# Patient Record
Sex: Female | Born: 1950 | Race: Black or African American | Hispanic: No | Marital: Single | Smoking: Former smoker
Health system: Southern US, Community
[De-identification: ages and names within clinical notes are randomized; demographics above are authoritative.]

## PROBLEM LIST (undated history)

## (undated) DIAGNOSIS — J9690 Respiratory failure, unspecified, unspecified whether with hypoxia or hypercapnia: Secondary | ICD-10-CM

## (undated) DIAGNOSIS — G4730 Sleep apnea, unspecified: Secondary | ICD-10-CM

## (undated) DIAGNOSIS — E119 Type 2 diabetes mellitus without complications: Secondary | ICD-10-CM

## (undated) DIAGNOSIS — I509 Heart failure, unspecified: Secondary | ICD-10-CM

## (undated) DIAGNOSIS — N289 Disorder of kidney and ureter, unspecified: Secondary | ICD-10-CM

## (undated) DIAGNOSIS — I480 Paroxysmal atrial fibrillation: Secondary | ICD-10-CM

---

## 2021-07-02 DIAGNOSIS — E1129 Type 2 diabetes mellitus with other diabetic kidney complication: Secondary | ICD-10-CM | POA: Diagnosis present

## 2021-07-02 DIAGNOSIS — Z950 Presence of cardiac pacemaker: Secondary | ICD-10-CM | POA: Diagnosis present

## 2021-07-02 DIAGNOSIS — Z794 Long term (current) use of insulin: Secondary | ICD-10-CM

## 2021-07-02 DIAGNOSIS — N189 Chronic kidney disease, unspecified: Secondary | ICD-10-CM | POA: Diagnosis present

## 2021-07-02 DIAGNOSIS — I5020 Unspecified systolic (congestive) heart failure: Secondary | ICD-10-CM | POA: Diagnosis present

## 2021-07-02 DIAGNOSIS — E1140 Type 2 diabetes mellitus with diabetic neuropathy, unspecified: Secondary | ICD-10-CM

## 2021-07-02 DIAGNOSIS — D631 Anemia in chronic kidney disease: Secondary | ICD-10-CM | POA: Diagnosis present

## 2021-07-10 ENCOUNTER — Emergency Department
Admission: EM | Admit: 2021-07-10 | Discharge: 2021-07-10 | Disposition: A | Discharge status: Home / Self Care | Payer: Medicare HMO | Attending: Emergency Medicine | Admitting: Emergency Medicine

## 2021-07-10 ENCOUNTER — Other Ambulatory Visit: Payer: Self-pay

## 2021-07-10 ENCOUNTER — Emergency Department: Payer: Medicare HMO

## 2021-07-10 ENCOUNTER — Encounter: Payer: Self-pay | Admitting: Radiology

## 2021-07-10 DIAGNOSIS — R42 Dizziness and giddiness: Secondary | ICD-10-CM | POA: Diagnosis present

## 2021-07-10 DIAGNOSIS — Z992 Dependence on renal dialysis: Secondary | ICD-10-CM | POA: Diagnosis not present

## 2021-07-10 DIAGNOSIS — R1110 Vomiting, unspecified: Secondary | ICD-10-CM | POA: Insufficient documentation

## 2021-07-10 DIAGNOSIS — N186 End stage renal disease: Secondary | ICD-10-CM | POA: Diagnosis not present

## 2021-07-10 DIAGNOSIS — I509 Heart failure, unspecified: Secondary | ICD-10-CM | POA: Insufficient documentation

## 2021-07-10 DIAGNOSIS — Z20822 Contact with and (suspected) exposure to covid-19: Secondary | ICD-10-CM | POA: Insufficient documentation

## 2021-07-10 DIAGNOSIS — M533 Sacrococcygeal disorders, not elsewhere classified: Secondary | ICD-10-CM | POA: Insufficient documentation

## 2021-07-10 DIAGNOSIS — E1122 Type 2 diabetes mellitus with diabetic chronic kidney disease: Secondary | ICD-10-CM | POA: Insufficient documentation

## 2021-07-10 HISTORY — DX: Heart failure, unspecified: I50.9

## 2021-07-10 HISTORY — DX: Type 2 diabetes mellitus without complications: E11.9

## 2021-07-10 HISTORY — DX: Sleep apnea, unspecified: G47.30

## 2021-07-10 HISTORY — DX: Disorder of kidney and ureter, unspecified: N28.9

## 2021-07-10 HISTORY — DX: Respiratory failure, unspecified, unspecified whether with hypoxia or hypercapnia: J96.90

## 2021-07-10 HISTORY — DX: Paroxysmal atrial fibrillation: I48.0

## 2021-07-10 LAB — CBC WITH DIFFERENTIAL/PLATELET
Abs Immature Granulocytes: 0.03 10*3/uL (ref 0.00–0.07)
Basophils Absolute: 0.1 10*3/uL (ref 0.0–0.1)
Basophils Relative: 1 %
Eosinophils Absolute: 0.3 10*3/uL (ref 0.0–0.5)
Eosinophils Relative: 3 %
HCT: 33.1 % — ABNORMAL LOW (ref 36.0–46.0)
Hemoglobin: 9.5 g/dL — ABNORMAL LOW (ref 12.0–15.0)
Immature Granulocytes: 0 %
Lymphocytes Relative: 17 %
Lymphs Abs: 1.7 10*3/uL (ref 0.7–4.0)
MCH: 25.8 pg — ABNORMAL LOW (ref 26.0–34.0)
MCHC: 28.7 g/dL — ABNORMAL LOW (ref 30.0–36.0)
MCV: 89.9 fL (ref 80.0–100.0)
Monocytes Absolute: 1.1 10*3/uL — ABNORMAL HIGH (ref 0.1–1.0)
Monocytes Relative: 11 %
Neutro Abs: 6.7 10*3/uL (ref 1.7–7.7)
Neutrophils Relative %: 68 %
Platelets: 234 10*3/uL (ref 150–400)
RBC: 3.68 MIL/uL — ABNORMAL LOW (ref 3.87–5.11)
RDW: 20.1 % — ABNORMAL HIGH (ref 11.5–15.5)
WBC: 9.9 10*3/uL (ref 4.0–10.5)
nRBC: 0.4 % — ABNORMAL HIGH (ref 0.0–0.2)

## 2021-07-10 LAB — COMPREHENSIVE METABOLIC PANEL
ALT: 8 U/L (ref 0–44)
AST: 13 U/L — ABNORMAL LOW (ref 15–41)
Albumin: 2.8 g/dL — ABNORMAL LOW (ref 3.5–5.0)
Alkaline Phosphatase: 136 U/L — ABNORMAL HIGH (ref 38–126)
Anion gap: 14 (ref 5–15)
BUN: 38 mg/dL — ABNORMAL HIGH (ref 8–23)
CO2: 26 mmol/L (ref 22–32)
Calcium: 8.6 mg/dL — ABNORMAL LOW (ref 8.9–10.3)
Chloride: 96 mmol/L — ABNORMAL LOW (ref 98–111)
Creatinine, Ser: 5.78 mg/dL — ABNORMAL HIGH (ref 0.44–1.00)
GFR, Estimated: 8 mL/min — ABNORMAL LOW (ref >60)
Glucose, Bld: 152 mg/dL — ABNORMAL HIGH (ref 70–99)
Potassium: 4.8 mmol/L (ref 3.5–5.1)
Sodium: 136 mmol/L (ref 135–145)
Total Bilirubin: 1.6 mg/dL — ABNORMAL HIGH (ref 0.3–1.2)
Total Protein: 7.1 g/dL (ref 6.5–8.1)

## 2021-07-10 LAB — RESP PANEL BY RT-PCR (FLU A&B, COVID) ARPGX2
Influenza A by PCR: NEGATIVE
Influenza B by PCR: NEGATIVE
SARS Coronavirus 2 by RT PCR: NEGATIVE

## 2021-07-10 LAB — TROPONIN I (HIGH SENSITIVITY)
Troponin I (High Sensitivity): 27 ng/L — ABNORMAL HIGH (ref <18)
Troponin I (High Sensitivity): 25 ng/L — ABNORMAL HIGH (ref <18)

## 2021-07-10 LAB — LIPASE, BLOOD: Lipase: 29 U/L (ref 11–51)

## 2021-07-10 MED ORDER — SODIUM CHLORIDE 0.9 % IV BOLUS
500.0000 mL | Freq: Once | INTRAVENOUS | Status: AC
  Administered 2021-07-10: 500 mL via INTRAVENOUS

## 2021-07-10 MED ORDER — OXYCODONE-ACETAMINOPHEN 5-325 MG PO TABS
1.0000 | ORAL_TABLET | Freq: Once | ORAL | Status: AC
  Administered 2021-07-10: 1 via ORAL
  Filled 2021-07-10: qty 1

## 2021-07-10 MED ORDER — IOHEXOL 350 MG/ML SOLN
100.0000 mL | Freq: Once | INTRAVENOUS | Status: AC | PRN
  Administered 2021-07-10: 100 mL via INTRAVENOUS

## 2021-07-10 NOTE — Discharge Instructions (Addendum)
Your lab tests and CT scan of the abdomen today were all okay.  Please call your dialysis clinic today to arrange a make-up session, and continue following your usual dialysis schedule.

## 2021-07-10 NOTE — ED Triage Notes (Signed)
Pt states that she started feeling 3 days ago. Pt states she thinks that its because a pill they are giving her. Pt cannot recall the pill. Pt is a dialysis patient and last had dialysis on Tuesday. Pt also complaining of a sore on her bottom hurting. Pt does not ambulate.

## 2021-07-10 NOTE — ED Notes (Signed)
X3 unsuccessfull ultrasound IV sticks by manager Marya Amsler, RN. Dr. Joni Fears made aware.

## 2021-07-10 NOTE — ED Provider Notes (Signed)
[facility] Emergency Department Provider Note  ____________________________________________  Time seen: Approximately 9:09 AM  I have reviewed the triage vital signs and the nursing notes.   HISTORY  Chief Complaint Dizziness    HPI Linda Quinn is a 70 y.o. female with a history of CHF diabetes paroxysmal atrial fibrillation and end-stage renal disease on hemodialysis Tuesday Thursday Saturday who comes ED complaining of lightheadedness for the past 3 days.  Also reports vomiting yesterday.  Feels like she has been eating less but still able to take food by mouth.  Also complains of some sacral pain during this time.  No chest pain or shortness of breath, no abdominal pain.  No fever.  No cough.  No body aches.  Last had dialysis 2 days ago.   Past Medical History:  Diagnosis Date   CHF (congestive heart failure) (HCC)    Diabetes mellitus without complication (HCC)    Paroxysmal atrial fibrillation (HCC)    Renal disorder    Respiratory failure (HCC)    Sleep apnea      There are no problems to display for this patient.       Prior to Admission medications   Not on File  MAR from SNF reviewed   Allergies Duricef [cefadroxil]   No family history on file.  Social History Social History   Substance Use Topics   Alcohol use: Not Currently   Drug use: Not Currently    Review of Systems  Constitutional:   No fever or chills.  ENT:   No sore throat. No rhinorrhea. Cardiovascular:   No chest pain or syncope. Respiratory:   No dyspnea or cough. Gastrointestinal:   Negative for abdominal pain, vomiting and diarrhea.  Musculoskeletal:   Positive sacral pain All other systems reviewed and are negative except as documented above in ROS and HPI.  ____________________________________________   PHYSICAL EXAM:  VITAL SIGNS: ED Triage Vitals  Enc Vitals Group     BP 07/10/21 0712 (!) 161/130     Pulse Rate 07/10/21 0712 76      Resp --      Temp 07/10/21 0712 97.6 F (36.4 C)     Temp Source 07/10/21 0712 Oral     SpO2 07/10/21 0709 94 %     Weight 07/10/21 0714 285 lb (129.3 kg)     Height 07/10/21 0714 '5\' 8"'$  (1.727 m)     Head Circumference --      Peak Flow --      Pain Score --      Pain Loc --      Pain Edu? --      Excl. in [place]? --     Vital signs reviewed, nursing assessments reviewed.   Constitutional:   Alert and oriented.  Chronically ill-appearing, morbidly obese Eyes:   Conjunctivae are normal. EOMI. PERRL. ENT      Head:   Normocephalic and atraumatic.      Nose:   Wearing a mask.      Mouth/Throat:   Wearing a mask.      Neck:   No meningismus. Full ROM. Hematological/Lymphatic/Immunilogical:   No cervical lymphadenopathy. Cardiovascular:   RRR. Symmetric bilateral radial and DP pulses.  No murmurs. Cap refill less than 2 seconds. Respiratory:   Normal respiratory effort without tachypnea/retractions. Breath sounds are clear and equal bilaterally. No wheezes/rales/rhonchi. Gastrointestinal:   Soft and nontender. Non distended. There is no CVA tenderness.  No rebound, rigidity, or guarding.  Brown stool,  Hemoccult negative Genitourinary:   deferred Musculoskeletal:   Normal range of motion in all extremities. No joint effusions.  No lower extremity tenderness.  No edema.  No identifiable sacral wound.  No bony tenderness Neurologic:   Normal speech and language.  Motor grossly intact. No acute focal neurologic deficits are appreciated.  Skin:    Skin is warm, dry and intact. No rash noted.  No petechiae, purpura, or bullae.  ____________________________________________    LABS (pertinent positives/negatives) (all labs ordered are listed, but only abnormal results are displayed) Labs Reviewed  COMPREHENSIVE METABOLIC PANEL - Abnormal; Notable for the following components:      Result Value   Chloride 96 (*)    Glucose, Bld 152 (*)    BUN 38 (*)    Creatinine, Ser 5.78 (*)     Calcium 8.6 (*)    Albumin 2.8 (*)    AST 13 (*)    Alkaline Phosphatase 136 (*)    Total Bilirubin 1.6 (*)    GFR, Estimated 8 (*)    All other components within normal limits  CBC WITH DIFFERENTIAL/PLATELET - Abnormal; Notable for the following components:   RBC 3.68 (*)    Hemoglobin 9.5 (*)    HCT 33.1 (*)    MCH 25.8 (*)    MCHC 28.7 (*)    RDW 20.1 (*)    nRBC 0.4 (*)    Monocytes Absolute 1.1 (*)    All other components within normal limits  TROPONIN I (HIGH SENSITIVITY) - Abnormal; Notable for the following components:   Troponin I (High Sensitivity) 27 (*)    All other components within normal limits  TROPONIN I (HIGH SENSITIVITY) - Abnormal; Notable for the following components:   Troponin I (High Sensitivity) 25 (*)    All other components within normal limits  RESP PANEL BY RT-PCR (FLU A&B, COVID) ARPGX2  LIPASE, BLOOD   ____________________________________________   EKG  Interpreted by me Sinus rhythm rate of 77, normal axis and intervals.  Left bundle branch block.  No acute ischemic changes.  ____________________________________________    RADIOLOGY  CT ABDOMEN PELVIS W CONTRAST  Result Date: 07/10/2021 CLINICAL DATA:  Nausea and vomiting for 3 days EXAM: CT ABDOMEN AND PELVIS WITH CONTRAST TECHNIQUE: Multidetector CT imaging of the abdomen and pelvis was performed using the standard protocol following bolus administration of intravenous contrast. CONTRAST:  162m OMNIPAQUE IOHEXOL 350 MG/ML SOLN COMPARISON:  None. FINDINGS: Lower chest: Mild basilar atelectasis is noted. Hepatobiliary: Fatty infiltration of the liver is noted. Gallbladder is well distended with dependent gallstones. No complicating factors are seen. Pancreas: Unremarkable. No pancreatic ductal dilatation or surrounding inflammatory changes. Spleen: Normal in size without focal abnormality. Adrenals/Urinary Tract: Adrenal glands are within normal limits. Kidneys demonstrate severe renal  vascular calcifications consistent with the given clinical history of end-stage renal disease. No significant of excretion of contrast is noted. The bladder is decompressed. Stomach/Bowel: Colon shows no obstructive or inflammatory changes. The appendix is within normal limits. Small bowel shows no obstructive or inflammatory changes. Stomach is within normal limits with the exception of a small sliding-type hiatal hernia. Vascular/Lymphatic: Significant atherosclerotic calcifications are noted throughout the vascular tree consistent with the given clinical history of end-stage renal disease. No sizable lymphadenopathy is identified. Reproductive: Status post hysterectomy. No adnexal masses. Other: No free fluid is noted.  Changes of anasarca are noted. Musculoskeletal: Degenerative changes of lumbar spine are noted. IMPRESSION: Cholelithiasis without complicating factors. Mild anasarca. Fatty liver. Diffuse vascular calcifications.  Electronically Signed   By: Inez Catalina M.D.   On: 07/10/2021 11:34   DG Chest Portable 1 View  Result Date: 07/10/2021 CLINICAL DATA:  Dizziness.  In stage renal disease. EXAM: PORTABLE CHEST 1 VIEW COMPARISON:  No prior. FINDINGS: Dialysis catheter with tip over cavoatrial junction. Cardiac pacer noted with lead tips over the right atrium right ventricle. Cardiomegaly. Mild bilateral interstitial prominence. Mild interstitial edema cannot be excluded. Low lung volumes with basilar atelectasis. Small left pleural effusion cannot be excluded. No pneumothorax. IMPRESSION: 1.  Dialysis catheter with tip over cavoatrial junction. 2. Cardiac pacer noted good anatomic position. Cardiomegaly with mild bilateral interstitial prominence suggesting interstitial edema. Small left pleural effusion cannot be excluded. 3.  Low lung volumes with bibasilar atelectasis. Electronically Signed   By: Marcello Moores  Register   On: 07/10/2021 08:12     ____________________________________________   PROCEDURES Angiocath insertion  Date/Time: 07/10/2021 12:27 PM Performed by: Carrie Mew, MD Authorized by: Carrie Mew, MD  Consent: Verbal consent obtained. Consent given by: patient Patient identity confirmed: verbally with patient Preparation: Patient was prepped and draped in the usual sterile fashion. Local anesthesia used: no  Anesthesia: Local anesthesia used: no  Sedation: Patient sedated: no  Patient tolerance: patient tolerated the procedure well with no immediate complications Comments: [place] continuous visualization. 1 attempt, EBL 0.  Necessary due to multiple failed attempts by nursing.    ____________________________________________  DIFFERENTIAL DIAGNOSIS   Electrolyte abnormality, dehydration, viral illness, pneumonia intracranial hemorrhage  CLINICAL IMPRESSION / ASSESSMENT AND PLAN / ED COURSE  Medications ordered in the ED: Medications  oxyCODONE-acetaminophen (PERCOCET/ROXICET) 5-325 MG per tablet 1 tablet (1 tablet Oral Given 07/10/21 0804)  sodium chloride 0.9 % bolus 500 mL (0 mLs Intravenous Stopped 07/10/21 1005)  iohexol (OMNIPAQUE) 350 MG/ML injection 100 mL (100 mLs Intravenous Contrast Given 07/10/21 1048)    Pertinent labs & imaging results that were available during my care of the patient were reviewed by me and considered in my medical decision making (see chart for details).  Linda Quinn was evaluated in Emergency Department on 07/10/2021 for the symptoms described in the history of present illness. She was evaluated in the context of the global COVID-19 pandemic, which necessitated consideration that the patient might be at risk for infection with the SARS-CoV-2 virus that causes COVID-19. Institutional protocols and algorithms that pertain to the evaluation of patients at risk for COVID-19 are in a state of rapid change based on information released by regulatory bodies including  the CDC and federal and state organizations. These policies and algorithms were followed during the patient's care in the ED.   Patient presents with lightheadedness for 3 days.  Also some musculoskeletal sacral pain which I think is due to being bedbound and prolonged pressure on the area, although I do not see any evidence of pressure ulcer at this point.  Will give small fluid bolus, check labs and COVID test.  Clinical Course as of 07/10/21 1226  Thu Jul 10, 2021  0934 Peripheral IV placed by me under ultrasound visualization.  20-gauge in right AC/median cubital vein. [PS]    Clinical Course User Index [PS] Carrie Mew, MD    ----------------------------------------- 12:26 PM on 07/10/2021 ----------------------------------------- Labs show baseline ESRD, otherwise unremarkable.  Serial troponins are flat.  CT abdomen pelvis unremarkable.  Suspect viral syndrome.  With reassuring work-up, patient can be discharged back to her healthcare facility and proceed with routine dialysis.   ____________________________________________   FINAL CLINICAL IMPRESSION(S) / ED  DIAGNOSES    Final diagnoses:  Dizziness  ESRD on hemodialysis [facility])  Morbid obesity [facility])     ED Discharge Orders     None       Portions of this note were generated with dragon dictation software. Dictation errors may occur despite best attempts at proofreading.    Carrie Mew, MD 07/10/21 1227

## 2021-07-23 ENCOUNTER — Emergency Department: Payer: Medicare HMO

## 2021-07-23 ENCOUNTER — Encounter: Payer: Self-pay | Admitting: Emergency Medicine

## 2021-07-23 ENCOUNTER — Other Ambulatory Visit: Payer: Self-pay

## 2021-07-23 ENCOUNTER — Inpatient Hospital Stay
Admission: EM | Admit: 2021-07-23 | Discharge: 2021-07-29 | DRG: 862 | Disposition: A | Discharge status: Skilled Nursing Facility | Payer: Medicare HMO | Attending: Internal Medicine | Admitting: Internal Medicine

## 2021-07-23 DIAGNOSIS — I4819 Other persistent atrial fibrillation: Secondary | ICD-10-CM | POA: Diagnosis present

## 2021-07-23 DIAGNOSIS — L89152 Pressure ulcer of sacral region, stage 2: Secondary | ICD-10-CM | POA: Diagnosis present

## 2021-07-23 DIAGNOSIS — D631 Anemia in chronic kidney disease: Secondary | ICD-10-CM | POA: Diagnosis present

## 2021-07-23 DIAGNOSIS — I5020 Unspecified systolic (congestive) heart failure: Secondary | ICD-10-CM | POA: Diagnosis present

## 2021-07-23 DIAGNOSIS — R0989 Other specified symptoms and signs involving the circulatory and respiratory systems: Secondary | ICD-10-CM

## 2021-07-23 DIAGNOSIS — E1122 Type 2 diabetes mellitus with diabetic chronic kidney disease: Secondary | ICD-10-CM | POA: Diagnosis present

## 2021-07-23 DIAGNOSIS — Z95810 Presence of automatic (implantable) cardiac defibrillator: Secondary | ICD-10-CM

## 2021-07-23 DIAGNOSIS — E1129 Type 2 diabetes mellitus with other diabetic kidney complication: Secondary | ICD-10-CM | POA: Diagnosis present

## 2021-07-23 DIAGNOSIS — G8929 Other chronic pain: Secondary | ICD-10-CM | POA: Diagnosis present

## 2021-07-23 DIAGNOSIS — I959 Hypotension, unspecified: Secondary | ICD-10-CM

## 2021-07-23 DIAGNOSIS — R188 Other ascites: Secondary | ICD-10-CM | POA: Diagnosis present

## 2021-07-23 DIAGNOSIS — Z20822 Contact with and (suspected) exposure to covid-19: Secondary | ICD-10-CM | POA: Diagnosis present

## 2021-07-23 DIAGNOSIS — Z888 Allergy status to other drugs, medicaments and biological substances status: Secondary | ICD-10-CM

## 2021-07-23 DIAGNOSIS — Z86711 Personal history of pulmonary embolism: Secondary | ICD-10-CM

## 2021-07-23 DIAGNOSIS — L89522 Pressure ulcer of left ankle, stage 2: Secondary | ICD-10-CM | POA: Diagnosis present

## 2021-07-23 DIAGNOSIS — Z882 Allergy status to sulfonamides status: Secondary | ICD-10-CM

## 2021-07-23 DIAGNOSIS — Y838 Other surgical procedures as the cause of abnormal reaction of the patient, or of later complication, without mention of misadventure at the time of the procedure: Secondary | ICD-10-CM | POA: Diagnosis present

## 2021-07-23 DIAGNOSIS — L89892 Pressure ulcer of other site, stage 2: Secondary | ICD-10-CM | POA: Diagnosis present

## 2021-07-23 DIAGNOSIS — Z6841 Body Mass Index (BMI) 40.0 and over, adult: Secondary | ICD-10-CM

## 2021-07-23 DIAGNOSIS — J811 Chronic pulmonary edema: Secondary | ICD-10-CM | POA: Diagnosis present

## 2021-07-23 DIAGNOSIS — Z992 Dependence on renal dialysis: Secondary | ICD-10-CM

## 2021-07-23 DIAGNOSIS — Z794 Long term (current) use of insulin: Secondary | ICD-10-CM

## 2021-07-23 DIAGNOSIS — I472 Ventricular tachycardia, unspecified: Secondary | ICD-10-CM

## 2021-07-23 DIAGNOSIS — T82111A Breakdown (mechanical) of cardiac pulse generator (battery), initial encounter: Secondary | ICD-10-CM

## 2021-07-23 DIAGNOSIS — I5023 Acute on chronic systolic (congestive) heart failure: Secondary | ICD-10-CM | POA: Diagnosis present

## 2021-07-23 DIAGNOSIS — Z950 Presence of cardiac pacemaker: Secondary | ICD-10-CM | POA: Diagnosis present

## 2021-07-23 DIAGNOSIS — I480 Paroxysmal atrial fibrillation: Secondary | ICD-10-CM

## 2021-07-23 DIAGNOSIS — L299 Pruritus, unspecified: Secondary | ICD-10-CM | POA: Diagnosis present

## 2021-07-23 DIAGNOSIS — L03311 Cellulitis of abdominal wall: Secondary | ICD-10-CM | POA: Diagnosis present

## 2021-07-23 DIAGNOSIS — I9589 Other hypotension: Secondary | ICD-10-CM | POA: Diagnosis present

## 2021-07-23 DIAGNOSIS — L8990 Pressure ulcer of unspecified site, unspecified stage: Secondary | ICD-10-CM | POA: Insufficient documentation

## 2021-07-23 DIAGNOSIS — N186 End stage renal disease: Secondary | ICD-10-CM | POA: Diagnosis present

## 2021-07-23 DIAGNOSIS — Z7982 Long term (current) use of aspirin: Secondary | ICD-10-CM

## 2021-07-23 DIAGNOSIS — N189 Chronic kidney disease, unspecified: Secondary | ICD-10-CM | POA: Diagnosis present

## 2021-07-23 DIAGNOSIS — T8149XA Infection following a procedure, other surgical site, initial encounter: Secondary | ICD-10-CM | POA: Diagnosis not present

## 2021-07-23 DIAGNOSIS — Z79899 Other long term (current) drug therapy: Secondary | ICD-10-CM

## 2021-07-23 DIAGNOSIS — I429 Cardiomyopathy, unspecified: Secondary | ICD-10-CM | POA: Diagnosis present

## 2021-07-23 DIAGNOSIS — Z7901 Long term (current) use of anticoagulants: Secondary | ICD-10-CM

## 2021-07-23 DIAGNOSIS — M79676 Pain in unspecified toe(s): Secondary | ICD-10-CM

## 2021-07-23 DIAGNOSIS — E1140 Type 2 diabetes mellitus with diabetic neuropathy, unspecified: Secondary | ICD-10-CM | POA: Diagnosis present

## 2021-07-23 DIAGNOSIS — Z8249 Family history of ischemic heart disease and other diseases of the circulatory system: Secondary | ICD-10-CM

## 2021-07-23 DIAGNOSIS — Y929 Unspecified place or not applicable: Secondary | ICD-10-CM

## 2021-07-23 DIAGNOSIS — Z7401 Bed confinement status: Secondary | ICD-10-CM

## 2021-07-23 LAB — CBC WITH DIFFERENTIAL/PLATELET
Abs Immature Granulocytes: 0.04 10*3/uL (ref 0.00–0.07)
Basophils Absolute: 0.1 10*3/uL (ref 0.0–0.1)
Basophils Relative: 1 %
Eosinophils Absolute: 0.3 10*3/uL (ref 0.0–0.5)
Eosinophils Relative: 2 %
HCT: 32.6 % — ABNORMAL LOW (ref 36.0–46.0)
Hemoglobin: 9.6 g/dL — ABNORMAL LOW (ref 12.0–15.0)
Immature Granulocytes: 0 %
Lymphocytes Relative: 15 %
Lymphs Abs: 1.6 10*3/uL (ref 0.7–4.0)
MCH: 26.7 pg (ref 26.0–34.0)
MCHC: 29.4 g/dL — ABNORMAL LOW (ref 30.0–36.0)
MCV: 90.6 fL (ref 80.0–100.0)
Monocytes Absolute: 1.2 10*3/uL — ABNORMAL HIGH (ref 0.1–1.0)
Monocytes Relative: 11 %
Neutro Abs: 7.7 10*3/uL (ref 1.7–7.7)
Neutrophils Relative %: 71 %
Platelets: 228 10*3/uL (ref 150–400)
RBC: 3.6 MIL/uL — ABNORMAL LOW (ref 3.87–5.11)
RDW: 22.6 % — ABNORMAL HIGH (ref 11.5–15.5)
Smear Review: NORMAL
WBC: 10.9 10*3/uL — ABNORMAL HIGH (ref 4.0–10.5)
nRBC: 0.5 % — ABNORMAL HIGH (ref 0.0–0.2)

## 2021-07-23 LAB — BASIC METABOLIC PANEL
Anion gap: 15 (ref 5–15)
BUN: 28 mg/dL — ABNORMAL HIGH (ref 8–23)
CO2: 25 mmol/L (ref 22–32)
Calcium: 8 mg/dL — ABNORMAL LOW (ref 8.9–10.3)
Chloride: 94 mmol/L — ABNORMAL LOW (ref 98–111)
Creatinine, Ser: 4.54 mg/dL — ABNORMAL HIGH (ref 0.44–1.00)
GFR, Estimated: 10 mL/min — ABNORMAL LOW (ref >60)
Glucose, Bld: 118 mg/dL — ABNORMAL HIGH (ref 70–99)
Potassium: 3.7 mmol/L (ref 3.5–5.1)
Sodium: 134 mmol/L — ABNORMAL LOW (ref 135–145)

## 2021-07-23 LAB — RESP PANEL BY RT-PCR (FLU A&B, COVID) ARPGX2
Influenza A by PCR: NEGATIVE
Influenza B by PCR: NEGATIVE
SARS Coronavirus 2 by RT PCR: NEGATIVE

## 2021-07-23 LAB — TROPONIN I (HIGH SENSITIVITY): Troponin I (High Sensitivity): 60 ng/L — ABNORMAL HIGH (ref <18)

## 2021-07-23 IMAGING — CT CT ABD-PELV W/ CM
2 of 5 series · 16 of 46 positions shown, 18 images · IV contrast (omnipaque)
Comparison: CT [DATE]

CLINICAL DATA: Distension epigastric pain

EXAM:
CT ABDOMEN AND PELVIS WITH CONTRAST
TECHNIQUE: Multidetector CT imaging of the abdomen and pelvis was performed
using the standard protocol following bolus administration of
intravenous contrast.
CONTRAST:  100mL OMNIPAQUE IOHEXOL 350 MG/ML SOLN

[Series 2: axial st · axial · 0.98mm/px · z∈[-615,-170]mm · 13 of 101 slices shown, 15 images]
[im 6/101  soft-tissue]
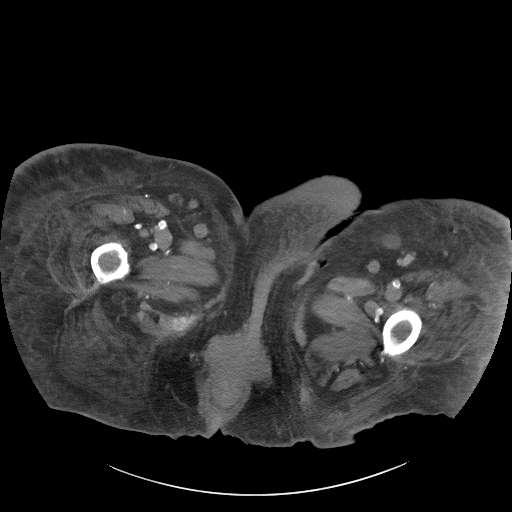
[im 6/101  bone]
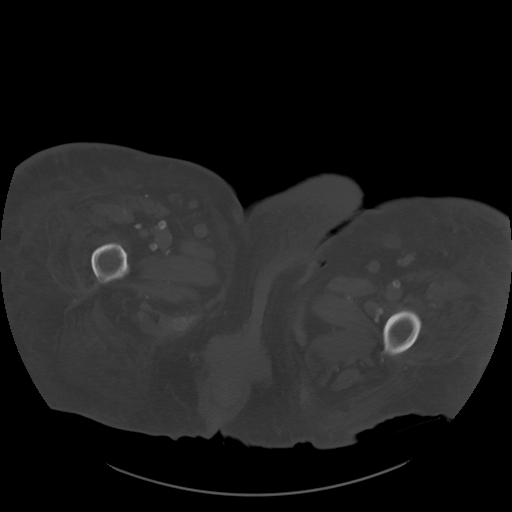
[im 16/101  soft-tissue]
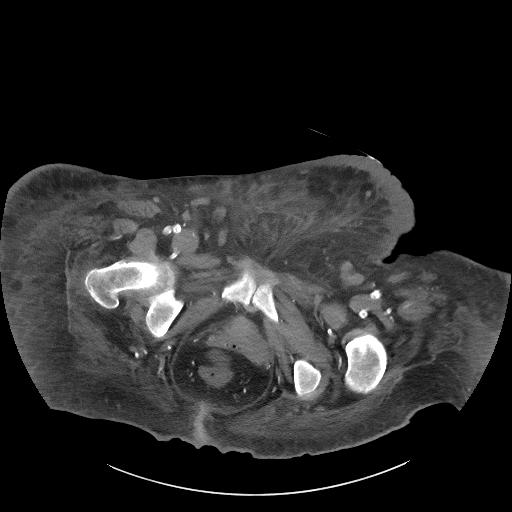
[im 22/101  soft-tissue]
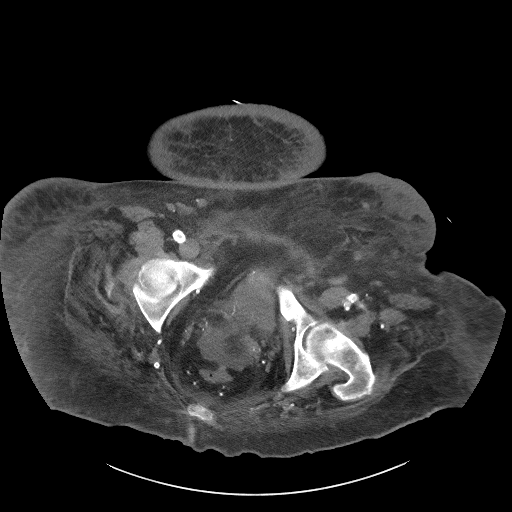
[im 27/101  soft-tissue]
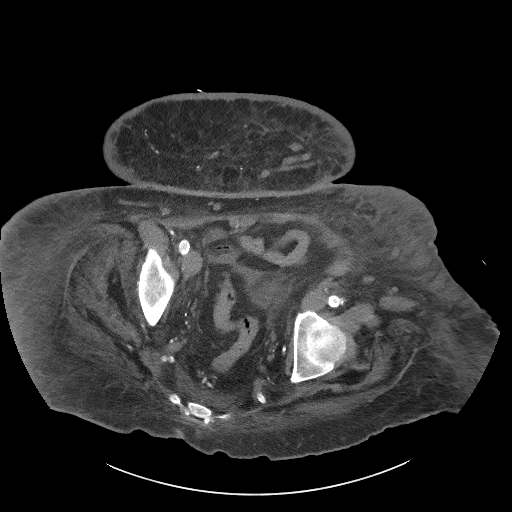
[im 37/101  soft-tissue]
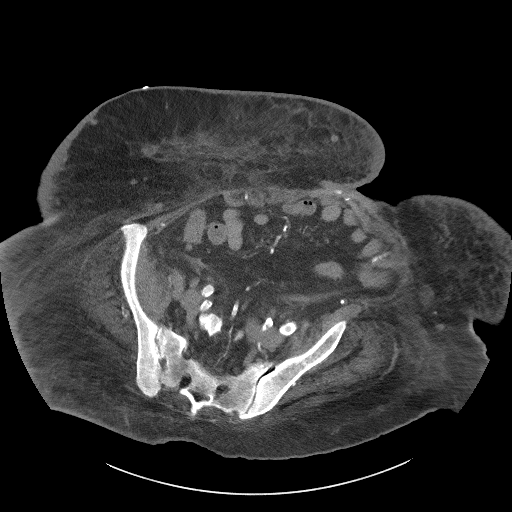
[im 43/101  soft-tissue]
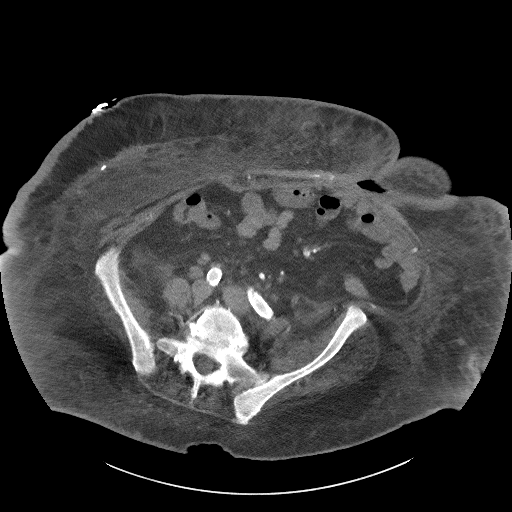
[im 53/101  soft-tissue]
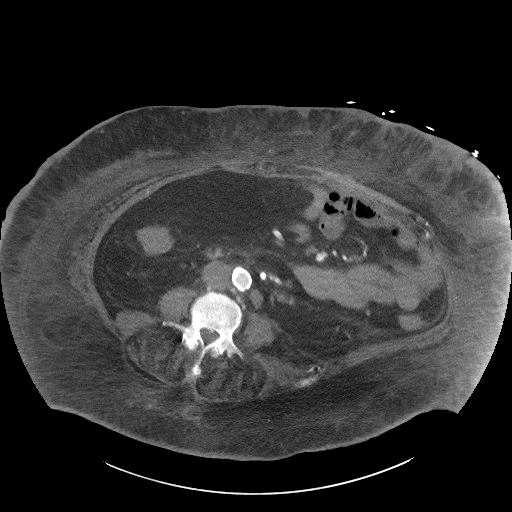
[im 58/101  soft-tissue]
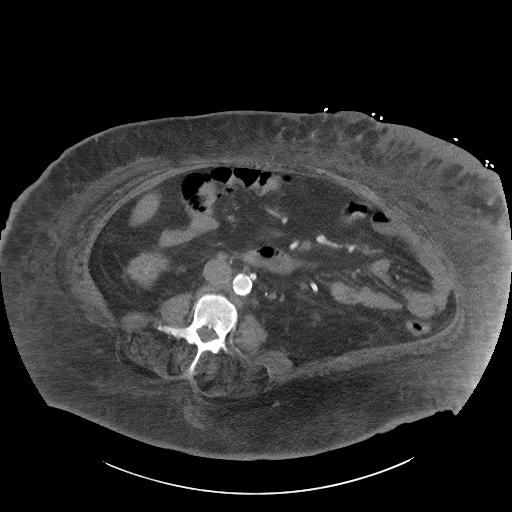
[im 64/101  soft-tissue]
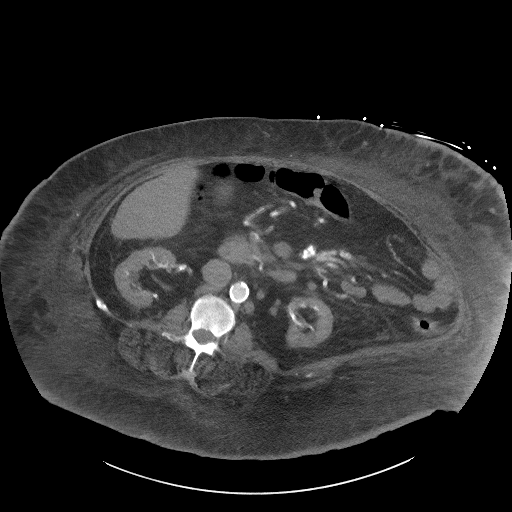
[im 64/101  bone]
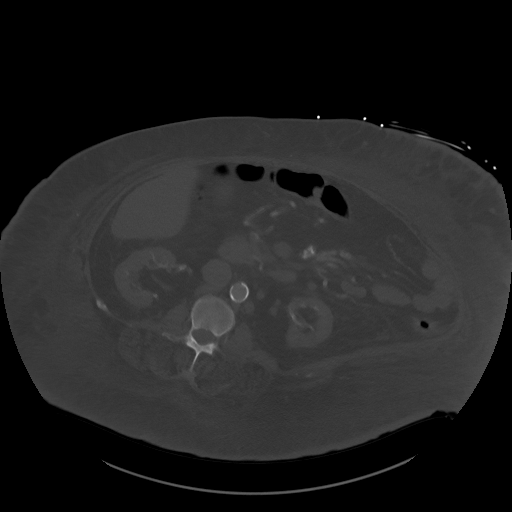
[im 74/101  soft-tissue]
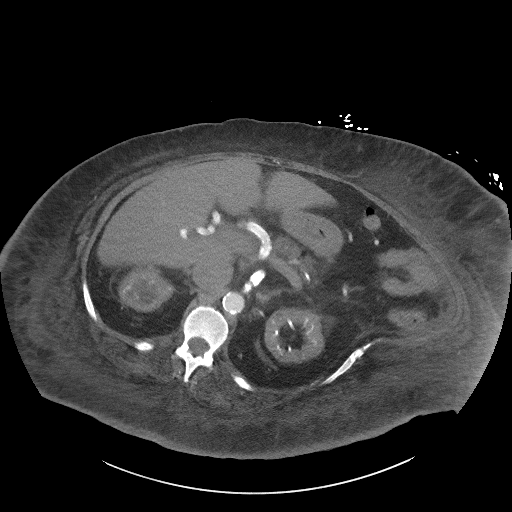
[im 79/101  soft-tissue]
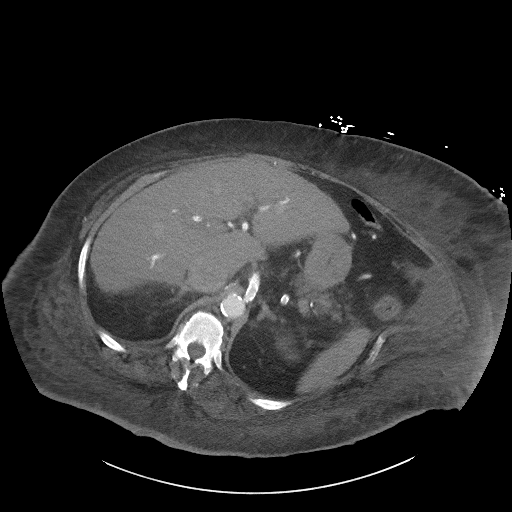
[im 85/101  soft-tissue]
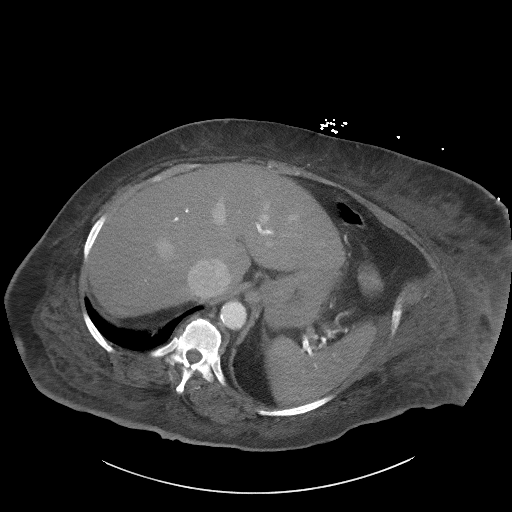
[im 95/101  soft-tissue]
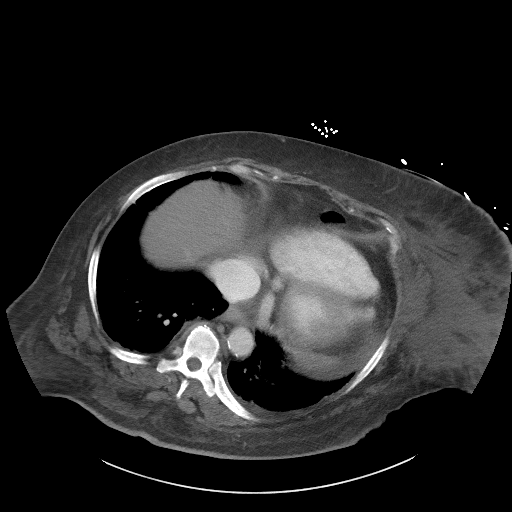

[Series 6: coronal st · coronal · 0.98mm/px · 3 of 123 slices shown]
[im 41/123  soft-tissue]
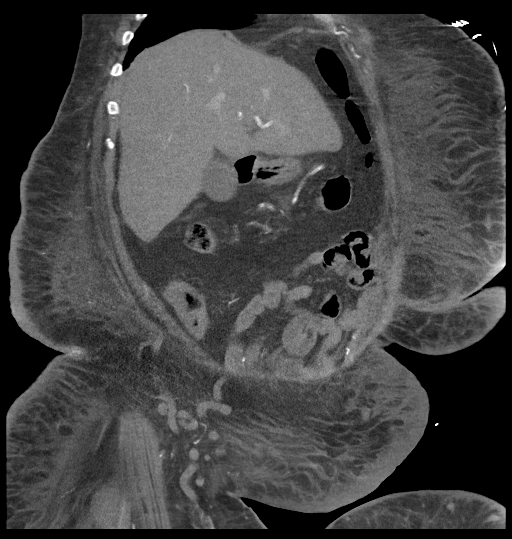
[im 55/123  soft-tissue]
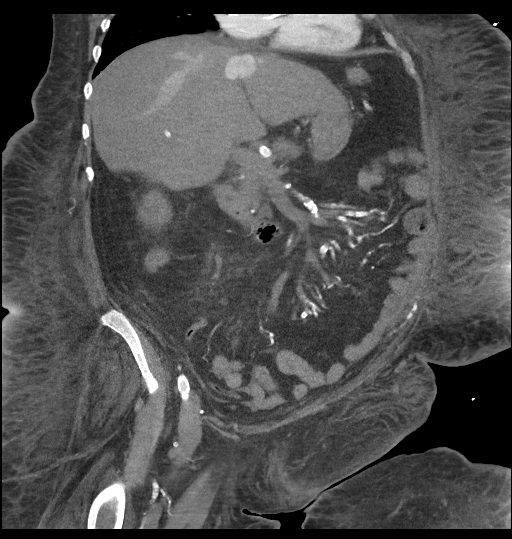
[im 68/123  soft-tissue]
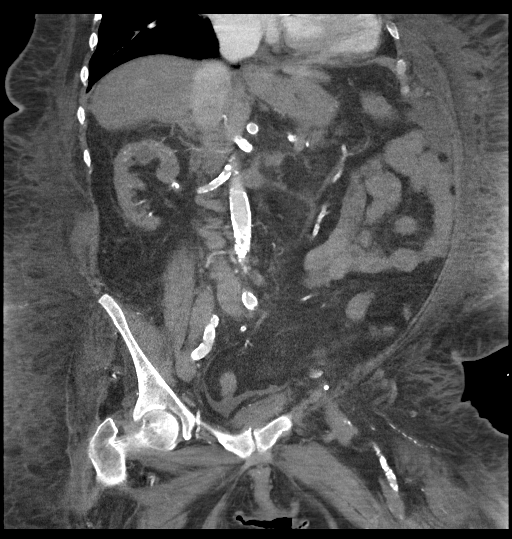

[16 of 46 positions shown; findings below may reference images not displayed]

FINDINGS: Lower chest: Lung bases demonstrate no acute consolidation. Trace
left pleural effusion. Cardiomegaly with partially visualized pacing
leads.

Hepatobiliary: Dilated IVC. No focal hepatic abnormality. Small
gallstones. No biliary dilatation

Pancreas: Unremarkable. No pancreatic ductal dilatation or
surrounding inflammatory changes.

Spleen: Normal in size without focal abnormality.

Adrenals/Urinary Tract: Adrenal glands are normal. Slightly atrophic
kidneys with extensive vascular calcification. Cysts upper pole
right kidney. No significant excretion on delayed images. No
hydronephrosis. The bladder is nearly empty

Stomach/Bowel: The stomach is nonenlarged. No dilated small bowel.
No acute bowel wall thickening. Negative appendix

Vascular/Lymphatic: Advanced vascular disease. No aneurysm. Stable
left greater than right iliac nodes. Left common iliac node measures
12 mm. Small bilateral inguinal nodes

Reproductive: Status post hysterectomy. No adnexal masses.

Other: No free air. Small free fluid in the abdomen and pelvis.
Diffuse skin thickening with extensive subcutaneous edema.

Musculoskeletal: No acute osseous abnormality. Grade 1
anterolisthesis L5 on S1 with degenerative change
IMPRESSION: 1. Cardiomegaly with trace left effusion.
2. Atrophic kidneys with no significant excretion of contrast
consistent with chronic kidney disease.
3. Diffuse skin thickening with extensive subcutaneous edema
consistent with anasarca. Small amount of abdominopelvic ascites

## 2021-07-23 MED ORDER — INSULIN ASPART 100 UNIT/ML IJ SOLN: 0.0000 [IU] | Freq: Every day | INTRAMUSCULAR | Status: DC

## 2021-07-23 MED ORDER — HEPARIN SODIUM (PORCINE) 5000 UNIT/ML IJ SOLN
5000.0000 [IU] | Freq: Three times a day (TID) | INTRAMUSCULAR | Status: DC
  Administered 2021-07-23: 5000 [IU] via SUBCUTANEOUS
  Filled 2021-07-23: qty 1

## 2021-07-23 MED ORDER — VANCOMYCIN HCL 1500 MG/300ML IV SOLN
1500.0000 mg | Freq: Once | INTRAVENOUS | Status: AC
  Administered 2021-07-24: 1500 mg via INTRAVENOUS
  Filled 2021-07-23 (×2): qty 300

## 2021-07-23 MED ORDER — IOHEXOL 350 MG/ML SOLN
100.0000 mL | Freq: Once | INTRAVENOUS | Status: AC | PRN
  Administered 2021-07-23: 100 mL via INTRAVENOUS

## 2021-07-23 MED ORDER — INSULIN ASPART 100 UNIT/ML IJ SOLN
0.0000 [IU] | Freq: Three times a day (TID) | INTRAMUSCULAR | Status: DC
  Administered 2021-07-26: 2 [IU] via SUBCUTANEOUS
  Administered 2021-07-26 – 2021-07-27 (×3): 1 [IU] via SUBCUTANEOUS
  Administered 2021-07-27: 2 [IU] via SUBCUTANEOUS
  Administered 2021-07-27: 1 [IU] via SUBCUTANEOUS
  Administered 2021-07-28 (×2): 2 [IU] via SUBCUTANEOUS
  Administered 2021-07-28: 1 [IU] via SUBCUTANEOUS
  Filled 2021-07-23 (×11): qty 1

## 2021-07-23 MED ORDER — SODIUM CHLORIDE 0.9 % IV SOLN
1.0000 g | INTRAVENOUS | Status: DC
  Administered 2021-07-24 – 2021-07-27 (×4): 1 g via INTRAVENOUS
  Filled 2021-07-23: qty 10
  Filled 2021-07-23: qty 1
  Filled 2021-07-23: qty 10
  Filled 2021-07-23: qty 1
  Filled 2021-07-23: qty 10

## 2021-07-23 MED ORDER — SODIUM CHLORIDE 0.9 % IV SOLN
2.0000 g | Freq: Once | INTRAVENOUS | Status: AC
  Administered 2021-07-23: 2 g via INTRAVENOUS
  Filled 2021-07-23: qty 20

## 2021-07-23 MED ORDER — SODIUM CHLORIDE 0.9 % IV SOLN: 2.0000 g | Freq: Once | INTRAVENOUS | Status: DC

## 2021-07-23 MED ORDER — VANCOMYCIN HCL IN DEXTROSE 1-5 GM/200ML-% IV SOLN: 1000.0000 mg | Freq: Once | INTRAVENOUS | Status: DC

## 2021-07-23 MED ORDER — VANCOMYCIN HCL IN DEXTROSE 1-5 GM/200ML-% IV SOLN
1000.0000 mg | Freq: Once | INTRAVENOUS | Status: AC
  Administered 2021-07-23: 1000 mg via INTRAVENOUS
  Filled 2021-07-23: qty 200

## 2021-07-23 NOTE — H&P (Addendum)
History and Physical    Linda Quinn [id number] DOB: [birth year] DOA: 07/23/2021  PCP: Pcp, No   Patient coming from: SNF  I have personally briefly reviewed patient's old medical records in [place]  Chief Complaint: pacemaker beeping  HPI: Linda Quinn is a 70 y.o. female with medical history significant for paroxysmal A. fib on Coumadin,, bedbound with history of ESRD on HD MWF, DM, pacemaker/AICD, anemia of CKD, class III obesity, who presents initially to the ED due to having her pacemaker beep for the past several days.  Patient was seen in the ED on 7/21 with a complaint of lightheadedness and nausea.  During initial evaluation in the ED she also complained of abdominal pain and distention and an oozing wound.  She denied fever or chills, cough or shortness of breath.  She denied chest pain or palpitations  ED course: On arrival, vitals unremarkable except for soft tissue blood pressure at 114/94 Blood work with WBC 11,000, hemoglobin 9.6, troponin of 60.  Otherwise, unremarkable for dialysis patient.  EKG, personally viewed and interpreted: Atrial paced rhythm with prolonged AV conduction at 70 bpm  Imaging: CT abdomen and pelvis with diffuse skin thickening and extensive subcutaneous edema consistent with anasarca.  Small amount of abdominal pelvis ascites Chest x-ray cardiomegaly and pulmonary vascular congestion.  AICD leads position unchanged  Pacemaker was interrogated with finding of V. tach on July 27 without shock administered.  Had been beeping due to inability to communicate with center.  Patient denies feeling as shock  Hospitalist consulted for admission for cellulitis.  Review of Systems: As per HPI otherwise all other systems on review of systems negative.    Past Medical History:  Diagnosis Date   CHF (congestive heart failure) (HCC)    Diabetes mellitus without complication (HCC)    Paroxysmal atrial fibrillation (HCC)    Renal disorder     Respiratory failure ([place])    Sleep apnea     History reviewed. No pertinent surgical history.   reports previous alcohol use. She reports previous drug use. No history on file for tobacco use.  Allergies  Allergen Reactions   Duricef [Cefadroxil] Hives    Family history: Family history reviewed and not pertinent   Prior to Admission medications   Not on File    Physical Exam: Vitals:   07/23/21 1700 07/23/21 1715 07/23/21 1935 07/23/21 2033  BP: (!) 153/142  (!) 118/95 (!) 104/44  Pulse:   70 70  Resp:  (!) '9 16 18  '$ Temp:      TempSrc:      SpO2:   93% 93%  Weight:      Height:         Vitals:   07/23/21 1700 07/23/21 1715 07/23/21 1935 07/23/21 2033  BP: (!) 153/142  (!) 118/95 (!) 104/44  Pulse:   70 70  Resp:  (!) '9 16 18  '$ Temp:      TempSrc:      SpO2:   93% 93%  Weight:      Height:          Constitutional: Alert and oriented x 3 . Not in any apparent distress HEENT:      Head: Normocephalic and atraumatic.         Eyes: PERLA, EOMI, Conjunctivae are normal. Sclera is non-icteric.       Mouth/Throat: Mucous membranes are moist.       Neck: Supple with no signs of meningismus. Cardiovascular: Regular rate and  rhythm. No murmurs, gallops, or rubs. 2+ symmetrical distal pulses are present . No JVD. No LE edema Respiratory: Respiratory effort normal .Lungs sounds clear bilaterally. No wheezes, crackles, or rhonchi.  Gastrointestinal: Adipose tissue, with a large edematous pannus, tender to palpation with skin breakdown in suprapubic area and skin folds, see skin Genitourinary: No CVA tenderness. Musculoskeletal: Nontender, disuse atrophy of feet.  No cyanosis, or erythema of extremities.  Mild edema bilateral feet Neurologic:  Face is symmetric. Moving all extremities. No gross focal neurologic deficits . Skin: Ulcer, 4 cm diameter with necrosis to fat on skin fold abdominal pannus to the left, and is well on the right, with sloughing psychiatric: Mood  and affect are normal    Labs on Admission: I have personally reviewed following labs and imaging studies  CBC: Recent Labs  Lab 07/23/21 1313  WBC 10.9*  NEUTROABS 7.7  HGB 9.6*  HCT 32.6*  MCV 90.6  PLT [id number]   Basic Metabolic Panel: Recent Labs  Lab 07/23/21 1313  NA 134*  K 3.7  CL 94*  CO2 25  GLUCOSE 118*  BUN 28*  CREATININE 4.54*  CALCIUM 8.0*   GFR: Estimated Creatinine Clearance: 17.2 mL/min (A) (by C-G formula based on SCr of 4.54 mg/dL (H)). Liver Function Tests: No results for input(s): AST, ALT, ALKPHOS, BILITOT, PROT, ALBUMIN in the last 168 hours. No results for input(s): LIPASE, AMYLASE in the last 168 hours. No results for input(s): AMMONIA in the last 168 hours. Coagulation Profile: No results for input(s): INR, PROTIME in the last 168 hours. Cardiac Enzymes: No results for input(s): CKTOTAL, CKMB, CKMBINDEX, TROPONINI in the last 168 hours. BNP (last 3 results) No results for input(s): PROBNP in the last 8760 hours. HbA1C: No results for input(s): HGBA1C in the last 72 hours. CBG: No results for input(s): GLUCAP in the last 168 hours. Lipid Profile: No results for input(s): CHOL, HDL, LDLCALC, TRIG, CHOLHDL, LDLDIRECT in the last 72 hours. Thyroid Function Tests: No results for input(s): TSH, T4TOTAL, FREET4, T3FREE, THYROIDAB in the last 72 hours. Anemia Panel: No results for input(s): VITAMINB12, FOLATE, FERRITIN, TIBC, IRON, RETICCTPCT in the last 72 hours. Urine analysis: No results found for: COLORURINE, APPEARANCEUR, [place], [place], GLUCOSEU, HGBUR, BILIRUBINUR, KETONESUR, PROTEINUR, UROBILINOGEN, NITRITE, LEUKOCYTESUR  Radiological Exams on Admission: DG Chest 2 View  Result Date: 07/23/2021 CLINICAL DATA:  Pacemaker check EXAM: CHEST - 2 VIEW COMPARISON:  07/10/2021 FINDINGS: Left chest wall AICD leads are in unchanged position. Hemodialysis catheter tip is in the proximal right atrium. There is moderate cardiomegaly with left  basilar atelectasis/consolidation. Diffuse pulmonary vascular congestion. IMPRESSION: Cardiomegaly and pulmonary vascular congestion. AICD leads are in unchanged position compared to 07/10/21. Electronically Signed   By: Ulyses Jarred M.D.   On: 07/23/2021 19:19   CT ABDOMEN PELVIS W CONTRAST  Result Date: 07/23/2021 CLINICAL DATA:  Distension epigastric pain EXAM: CT ABDOMEN AND PELVIS WITH CONTRAST TECHNIQUE: Multidetector CT imaging of the abdomen and pelvis was performed using the standard protocol following bolus administration of intravenous contrast. CONTRAST:  116m OMNIPAQUE IOHEXOL 350 MG/ML SOLN COMPARISON:  CT 07/10/2021 FINDINGS: Lower chest: Lung bases demonstrate no acute consolidation. Trace left pleural effusion. Cardiomegaly with partially visualized pacing leads. Hepatobiliary: Dilated IVC. No focal hepatic abnormality. Small gallstones. No biliary dilatation Pancreas: Unremarkable. No pancreatic ductal dilatation or surrounding inflammatory changes. Spleen: Normal in size without focal abnormality. Adrenals/Urinary Tract: Adrenal glands are normal. Slightly atrophic kidneys with extensive vascular calcification. Cysts upper pole right kidney. No  significant excretion on delayed images. No hydronephrosis. The bladder is nearly empty Stomach/Bowel: The stomach is nonenlarged. No dilated small bowel. No acute bowel wall thickening. Negative appendix Vascular/Lymphatic: Advanced vascular disease. No aneurysm. Stable left greater than right iliac nodes. Left common iliac node measures 12 mm. Small bilateral inguinal nodes Reproductive: Status post hysterectomy. No adnexal masses. Other: No free air. Small free fluid in the abdomen and pelvis. Diffuse skin thickening with extensive subcutaneous edema. Musculoskeletal: No acute osseous abnormality. Grade 1 anterolisthesis L5 on S1 with degenerative change IMPRESSION: 1. Cardiomegaly with trace left effusion. 2. Atrophic kidneys with no significant  excretion of contrast consistent with chronic kidney disease. 3. Diffuse skin thickening with extensive subcutaneous edema consistent with anasarca. Small amount of abdominopelvic ascites Electronically Signed   By: Donavan Foil M.D.   On: 07/23/2021 19:38     Assessment/Plan 69 year old female, bedbound, with history of paroxysmal A. fib on Coumadin, ESRD on HD MWF, DM, pacemaker/AICD, anemia of CKD, class III obesity, who presents initially to the ED due to having her pacemaker beep for the past several days complaining of painful wound on abdomen.  Pacemaker interrogation reveals shock was delivered for V. tach on 7/27.  Cellulitis of abdominal wall Ulcer abdominal pannus - Continue vancomycin and Rocephin started in the emergency room - CT abdomen showing thickening and subcutaneous edema  - Wound care and skin and decubitus precautions  AICD firing for V. tach on 7/27, initial encounter - Patient reports intermittent pacemaker BP but denies chest pain or shortness of breath - AICD/pacemaker interrogation reveals a shock on 7/27 for V. tach which patient does not recall - Keep potassium above 4 and magnesium over 2 - Cardiology consult  Anasarca, dependent edema Fluid overload from CHF exacerbation -CT abdomen and pelvis with extensive subcutaneous edema consistent with anasarca.  Small amount of abdominal pelvis ascites - Chest x-ray with pulmonary vascular congestion - IV Lasix, with hold parameters - Echocardiogram - Daily weights with intake and output monitoring - Cardiology consult in the a.m.  ESRD on HD - Nephrology consult for continuation of dialysis  Chronic hypotension - Continue midodrine  Paroxysmal atrial fibrillation - Pharmacy consult for for Coumadin management    Anemia in chronic kidney disease - Hemoglobin 9.6.  Baseline unknown.  Few records in Care Everywhere    Type 2 diabetes mellitus - Sliding scale insulin coverage   Obesity, Class III, BMI  40-49.9 Bedbound status - Complicating factor to overall prognosis and care - Increase nursing care - Fall aspiration and skin care precautions    DVT prophylaxis: Lovenox  Code Status: full code  Family Communication:  none  Disposition Plan: Back to previous home environment Consults called: Renal and cardiology Status: Observation    Athena Masse MD [facility]     07/23/2021, 9:24 PM

## 2021-07-23 NOTE — ED Provider Notes (Signed)
[facility] Emergency Department Provider Note  ____________________________________________   Event Date/Time   First MD Initiated Contact with Patient 07/23/21 1637     (approximate)  I have reviewed the triage vital signs and the nursing notes.   HISTORY  Chief Complaint Abdominal pain, pacemaker issue  HPI Linda Quinn is a 70 y.o. female with CHF, diabetes, Afib,lESRD on dialysis Monday, Wednesday, Friday through right permacath who comes in for pacemaker issue.  Pt reports pacemaker was beeping today. Not beeping now. However, her bigger concern is the abdominal pain.  Patient reports chronic abdominal pain but having worsening pain over the past few days, constant.  Patient has drainage noted from her abdomen and she is not sure how long its been there.  Denies any known fevers.  Patient does report a history of hernia repairs done over at [facility].  Unfortunately not able to see this information.     Past Medical History:  Diagnosis Date   CHF (congestive heart failure) (HCC)    Diabetes mellitus without complication (HCC)    Paroxysmal atrial fibrillation (HCC)    Renal disorder    Respiratory failure (HCC)    Sleep apnea     There are no problems to display for this patient.   History reviewed. No pertinent surgical history.  Prior to Admission medications   Not on File    Allergies Duricef [cefadroxil]  No family history on file.  Social History Social History   Substance Use Topics   Alcohol use: Not Currently   Drug use: Not Currently      Review of Systems Constitutional: No fever/chills Eyes: No visual changes. ENT: No sore throat. Cardiovascular: Denies chest pain.  Pacemaker beeping Respiratory: Denies shortness of breath. Gastrointestinal: Abdominal pain Genitourinary: Negative for dysuria. Musculoskeletal: Negative for back pain. Skin: Negative for rash. Neurological: Negative for headaches, focal weakness  or numbness. All other ROS negative ____________________________________________   PHYSICAL EXAM:  VITAL SIGNS: ED Triage Vitals [07/23/21 1304]  Enc Vitals Group     BP (!) 114/94     Pulse Rate 87     Resp 16     Temp 98 F (36.7 C)     Temp Source Oral     SpO2 94 %     Weight 289 lb (131.1 kg)     Height '5\' 8"'$  (1.727 m)     Head Circumference      Peak Flow      Pain Score 0     Pain Loc      Pain Edu?      Excl. in [place]?     Constitutional: Alert and oriented. Well appearing and in no acute distress. Eyes: Conjunctivae are normal. EOMI. Head: Atraumatic. Nose: No congestion/rhinnorhea. Mouth/Throat: Mucous membranes are moist.   Neck: No stridor. Trachea Midline. FROM Cardiovascular: Normal rate, regular rhythm. Grossly normal heart sounds.  Good peripheral circulation.  Right chest wall PermCath Respiratory: Normal respiratory effort.  No retractions. Lungs CTAB. Gastrointestinal: Distended, tender, areas of induration follow-up with pus noted draining from the lower abdomen.  No distention. No abdominal bruits.  Musculoskeletal: No lower extremity tenderness nor edema.  No joint effusions. Neurologic:  Normal speech and language. No gross focal neurologic deficits are appreciated.  Skin:  Skin is warm, dry and intact. No rash noted. Psychiatric: Mood and affect are normal. Speech and behavior are normal. GU: Deferred  Back: No obvious sacral ulcer. ____________________________________________   LABS (all labs ordered are  listed, but only abnormal results are displayed)  Labs Reviewed  CBC WITH DIFFERENTIAL/PLATELET - Abnormal; Notable for the following components:      Result Value   WBC 10.9 (*)    RBC 3.60 (*)    Hemoglobin 9.6 (*)    HCT 32.6 (*)    MCHC 29.4 (*)    RDW 22.6 (*)    nRBC 0.5 (*)    Monocytes Absolute 1.2 (*)    All other components within normal limits  BASIC METABOLIC PANEL - Abnormal; Notable for the following components:   Sodium  134 (*)    Chloride 94 (*)    Glucose, Bld 118 (*)    BUN 28 (*)    Creatinine, Ser 4.54 (*)    Calcium 8.0 (*)    GFR, Estimated 10 (*)    All other components within normal limits   ____________________________________________   ED ECG REPORT I, Vanessa [place], the attending physician, personally viewed and interpreted this ECG.  Heart rate is being atrial paced at a rate of 70, no ST elevation, no T wave versions, widened QRS secondary to pacing.  ____________________________________________  RADIOLOGY I, Vanessa Gleed, personally viewed and evaluated these images (plain radiographs) as part of my medical decision making, as well as reviewing the written report by the radiologist.  ED MD interpretation:  no pna AICD in place   Official radiology report(s): DG Chest 2 View  Result Date: 07/23/2021 CLINICAL DATA:  Pacemaker check EXAM: CHEST - 2 VIEW COMPARISON:  07/10/2021 FINDINGS: Left chest wall AICD leads are in unchanged position. Hemodialysis catheter tip is in the proximal right atrium. There is moderate cardiomegaly with left basilar atelectasis/consolidation. Diffuse pulmonary vascular congestion. IMPRESSION: Cardiomegaly and pulmonary vascular congestion. AICD leads are in unchanged position compared to 07/10/21. Electronically Signed   By: Ulyses Jarred M.D.   On: 07/23/2021 19:19   CT ABDOMEN PELVIS W CONTRAST  Result Date: 07/23/2021 CLINICAL DATA:  Distension epigastric pain EXAM: CT ABDOMEN AND PELVIS WITH CONTRAST TECHNIQUE: Multidetector CT imaging of the abdomen and pelvis was performed using the standard protocol following bolus administration of intravenous contrast. CONTRAST:  179m OMNIPAQUE IOHEXOL 350 MG/ML SOLN COMPARISON:  CT 07/10/2021 FINDINGS: Lower chest: Lung bases demonstrate no acute consolidation. Trace left pleural effusion. Cardiomegaly with partially visualized pacing leads. Hepatobiliary: Dilated IVC. No focal hepatic abnormality. Small gallstones. No  biliary dilatation Pancreas: Unremarkable. No pancreatic ductal dilatation or surrounding inflammatory changes. Spleen: Normal in size without focal abnormality. Adrenals/Urinary Tract: Adrenal glands are normal. Slightly atrophic kidneys with extensive vascular calcification. Cysts upper pole right kidney. No significant excretion on delayed images. No hydronephrosis. The bladder is nearly empty Stomach/Bowel: The stomach is nonenlarged. No dilated small bowel. No acute bowel wall thickening. Negative appendix Vascular/Lymphatic: Advanced vascular disease. No aneurysm. Stable left greater than right iliac nodes. Left common iliac node measures 12 mm. Small bilateral inguinal nodes Reproductive: Status post hysterectomy. No adnexal masses. Other: No free air. Small free fluid in the abdomen and pelvis. Diffuse skin thickening with extensive subcutaneous edema. Musculoskeletal: No acute osseous abnormality. Grade 1 anterolisthesis L5 on S1 with degenerative change IMPRESSION: 1. Cardiomegaly with trace left effusion. 2. Atrophic kidneys with no significant excretion of contrast consistent with chronic kidney disease. 3. Diffuse skin thickening with extensive subcutaneous edema consistent with anasarca. Small amount of abdominopelvic ascites Electronically Signed   By: KDonavan FoilM.D.   On: 07/23/2021 19:38    ____________________________________________   PROCEDURES  Procedure(s) performed (including Critical Care):  .1-3 Lead EKG Interpretation  Date/Time: 07/23/2021 9:14 PM Performed by: Vanessa Salinas, MD Authorized by: Vanessa E[place]ont, MD     Interpretation: abnormal     ECG rate:  70s   ECG rate assessment: normal     Rhythm: paced     Ectopy: none     Conduction: normal     ____________________________________________   INITIAL IMPRESSION / ASSESSMENT AND PLAN / ED COURSE  Julicia Seo was evaluated in Emergency Department on 07/23/2021 for the symptoms described in the history of  present illness. She was evaluated in the context of the global COVID-19 pandemic, which necessitated consideration that the patient might be at risk for infection with the SARS-CoV-2 virus that causes COVID-19. Institutional protocols and algorithms that pertain to the evaluation of patients at risk for COVID-19 are in a state of rapid change based on information released by regulatory bodies including the CDC and federal and state organizations. These policies and algorithms were followed during the patient's care in the ED.    Patient comes in with concern for beeping from her pacemaker.  We will do interrogation to make sure that the battery life is okay get chest x-ray to make sure place in wires are in place.  However on exam patient also has significant abdominal distention and abdominal pain with pus leaking from chronic wounds.  Will get CT abdomen to evaluate for any deeper infection such as abscess, perforation, hernia or any other concern  Per the report- Shocked her July 27 for Vtach. Unable to transmit to her monitor. The beeping was occurring because they were unable to transmit the event happening. Afib 80% of time. Fluid overload since December   Chest x-ray doubt any significant pulmonary edema.  Patient is dialysis patient may just need additional fluid taken off with dialysis.  Her CT scan does not show any other acute pathology.  Patient is able to do contrast due to the fact that she is been on dialysis for 3 years.  We will discussed with the hospital team for admission for abdominal cellulitis, drainage from the abdomen wall.  We will start on some broad-spectrum antibiotics.  Patient is afebrile and does not meet sepsis criteria.     ____________________________________________   FINAL CLINICAL IMPRESSION(S) / ED DIAGNOSES   Final diagnoses:  Ventricular tachycardia (HCC)  Abdominal wall cellulitis  ESRD (end stage renal disease) ([place])      MEDICATIONS GIVEN DURING  THIS VISIT:  Medications  vancomycin (VANCOCIN) IVPB 1000 mg/200 mL premix (has no administration in time range)    Followed by  vancomycin (VANCOREADY) IVPB 1500 mg/300 mL (has no administration in time range)  cefTRIAXone (ROCEPHIN) 2 g in sodium chloride 0.9 % 100 mL IVPB (has no administration in time range)  iohexol (OMNIPAQUE) 350 MG/ML injection 100 mL (100 mLs Intravenous Contrast Given 07/23/21 1851)     ED Discharge Orders     None        Note:  This document was prepared using Dragon voice recognition software and may include unintentional dictation errors.    Vanessa Rockledge, MD 07/23/21 2115

## 2021-07-23 NOTE — ED Triage Notes (Signed)
Pt to ED via [facility] from [facility]. Pt reports that she is hearing her pacemaker beeping. Pt states that it has been in for about 5 years, was placed by [facility]. Pt is in NAD. EMS reported that they did not hearing any beeping. No beeping heard during triage. Pt is in [facility] x 4.

## 2021-07-23 NOTE — ED Notes (Signed)
Report from [place], [place]

## 2021-07-23 NOTE — Consult Note (Addendum)
PHARMACY -  BRIEF ANTIBIOTIC NOTE   Pharmacy has received consult(s) for Vancomycin and aztreonam from an ED provider.  The patient's profile has been reviewed for ht/wt/allergies/indication/available labs.    --upon review of Allergies, patient with mild hives reaction to cefadroxil. Has tolerated Augmentin previously. Low cross-reactivity between cefadroxil and ceftriaxone due to different side chains.  One time order(s) placed for  Vancomycin 2500 mg Ceftriaxone 2 gram   Further antibiotics/pharmacy consults should be ordered by admitting physician if indicated.                       Thank you, Dorothe Pea, PharmD, BCPS Clinical Pharmacist   07/23/2021  9:06 PM

## 2021-07-23 NOTE — ED Notes (Signed)
Attempting to get IV access. Will attempt with ultrasound

## 2021-07-23 NOTE — ED Triage Notes (Signed)
Pt comes into the ED via [facility] from [facility] c/o hearing her pacemaker "beep".  No beeping heard by EMS, patient denies any other complaints.  Vitals stable with HR at 70 paced, 115/90, 98% RA.  Pt in NAD.

## 2021-07-24 ENCOUNTER — Observation Stay
Admit: 2021-07-24 | Discharge: 2021-07-24 | Disposition: A | Discharge status: Home / Self Care | Payer: Medicare HMO | Attending: Internal Medicine | Admitting: Internal Medicine

## 2021-07-24 ENCOUNTER — Encounter: Payer: Self-pay | Admitting: Internal Medicine

## 2021-07-24 DIAGNOSIS — L89892 Pressure ulcer of other site, stage 2: Secondary | ICD-10-CM | POA: Diagnosis present

## 2021-07-24 DIAGNOSIS — I429 Cardiomyopathy, unspecified: Secondary | ICD-10-CM | POA: Diagnosis present

## 2021-07-24 DIAGNOSIS — Z882 Allergy status to sulfonamides status: Secondary | ICD-10-CM | POA: Diagnosis not present

## 2021-07-24 DIAGNOSIS — Z992 Dependence on renal dialysis: Secondary | ICD-10-CM | POA: Diagnosis not present

## 2021-07-24 DIAGNOSIS — Y838 Other surgical procedures as the cause of abnormal reaction of the patient, or of later complication, without mention of misadventure at the time of the procedure: Secondary | ICD-10-CM | POA: Diagnosis present

## 2021-07-24 DIAGNOSIS — L03311 Cellulitis of abdominal wall: Secondary | ICD-10-CM | POA: Diagnosis present

## 2021-07-24 DIAGNOSIS — N186 End stage renal disease: Secondary | ICD-10-CM | POA: Diagnosis present

## 2021-07-24 DIAGNOSIS — I472 Ventricular tachycardia, unspecified: Secondary | ICD-10-CM

## 2021-07-24 DIAGNOSIS — L89152 Pressure ulcer of sacral region, stage 2: Secondary | ICD-10-CM | POA: Diagnosis present

## 2021-07-24 DIAGNOSIS — J811 Chronic pulmonary edema: Secondary | ICD-10-CM | POA: Diagnosis present

## 2021-07-24 DIAGNOSIS — I5023 Acute on chronic systolic (congestive) heart failure: Secondary | ICD-10-CM | POA: Diagnosis present

## 2021-07-24 DIAGNOSIS — E1122 Type 2 diabetes mellitus with diabetic chronic kidney disease: Secondary | ICD-10-CM | POA: Diagnosis present

## 2021-07-24 DIAGNOSIS — L89522 Pressure ulcer of left ankle, stage 2: Secondary | ICD-10-CM | POA: Diagnosis present

## 2021-07-24 DIAGNOSIS — I4819 Other persistent atrial fibrillation: Secondary | ICD-10-CM | POA: Diagnosis present

## 2021-07-24 DIAGNOSIS — I480 Paroxysmal atrial fibrillation: Secondary | ICD-10-CM | POA: Diagnosis not present

## 2021-07-24 DIAGNOSIS — Z20822 Contact with and (suspected) exposure to covid-19: Secondary | ICD-10-CM | POA: Diagnosis present

## 2021-07-24 DIAGNOSIS — R188 Other ascites: Secondary | ICD-10-CM | POA: Diagnosis present

## 2021-07-24 DIAGNOSIS — R0989 Other specified symptoms and signs involving the circulatory and respiratory systems: Secondary | ICD-10-CM | POA: Diagnosis not present

## 2021-07-24 DIAGNOSIS — N189 Chronic kidney disease, unspecified: Secondary | ICD-10-CM | POA: Diagnosis not present

## 2021-07-24 DIAGNOSIS — I5031 Acute diastolic (congestive) heart failure: Secondary | ICD-10-CM | POA: Diagnosis not present

## 2021-07-24 DIAGNOSIS — L8990 Pressure ulcer of unspecified site, unspecified stage: Secondary | ICD-10-CM | POA: Insufficient documentation

## 2021-07-24 DIAGNOSIS — D631 Anemia in chronic kidney disease: Secondary | ICD-10-CM

## 2021-07-24 DIAGNOSIS — T8149XA Infection following a procedure, other surgical site, initial encounter: Secondary | ICD-10-CM | POA: Diagnosis present

## 2021-07-24 DIAGNOSIS — E1140 Type 2 diabetes mellitus with diabetic neuropathy, unspecified: Secondary | ICD-10-CM | POA: Diagnosis present

## 2021-07-24 DIAGNOSIS — I9589 Other hypotension: Secondary | ICD-10-CM | POA: Diagnosis present

## 2021-07-24 DIAGNOSIS — L299 Pruritus, unspecified: Secondary | ICD-10-CM | POA: Diagnosis present

## 2021-07-24 DIAGNOSIS — Z6841 Body Mass Index (BMI) 40.0 and over, adult: Secondary | ICD-10-CM | POA: Diagnosis not present

## 2021-07-24 DIAGNOSIS — Y929 Unspecified place or not applicable: Secondary | ICD-10-CM | POA: Diagnosis not present

## 2021-07-24 DIAGNOSIS — Z888 Allergy status to other drugs, medicaments and biological substances status: Secondary | ICD-10-CM | POA: Diagnosis not present

## 2021-07-24 DIAGNOSIS — G8929 Other chronic pain: Secondary | ICD-10-CM | POA: Diagnosis present

## 2021-07-24 LAB — BASIC METABOLIC PANEL
Anion gap: 12 (ref 5–15)
BUN: 31 mg/dL — ABNORMAL HIGH (ref 8–23)
CO2: 28 mmol/L (ref 22–32)
Calcium: 7.8 mg/dL — ABNORMAL LOW (ref 8.9–10.3)
Chloride: 95 mmol/L — ABNORMAL LOW (ref 98–111)
Creatinine, Ser: 4.99 mg/dL — ABNORMAL HIGH (ref 0.44–1.00)
GFR, Estimated: 9 mL/min — ABNORMAL LOW (ref >60)
Glucose, Bld: 140 mg/dL — ABNORMAL HIGH (ref 70–99)
Potassium: 3.9 mmol/L (ref 3.5–5.1)
Sodium: 135 mmol/L (ref 135–145)

## 2021-07-24 LAB — HEMOGLOBIN A1C
Hgb A1c MFr Bld: 7.3 % — ABNORMAL HIGH (ref 4.8–5.6)
Mean Plasma Glucose: 162.81 mg/dL

## 2021-07-24 LAB — CBC
HCT: 31.6 % — ABNORMAL LOW (ref 36.0–46.0)
Hemoglobin: 9.3 g/dL — ABNORMAL LOW (ref 12.0–15.0)
MCH: 26.8 pg (ref 26.0–34.0)
MCHC: 29.4 g/dL — ABNORMAL LOW (ref 30.0–36.0)
MCV: 91.1 fL (ref 80.0–100.0)
Platelets: 205 10*3/uL (ref 150–400)
RBC: 3.47 MIL/uL — ABNORMAL LOW (ref 3.87–5.11)
RDW: 22.6 % — ABNORMAL HIGH (ref 11.5–15.5)
WBC: 10.4 10*3/uL (ref 4.0–10.5)
nRBC: 0.5 % — ABNORMAL HIGH (ref 0.0–0.2)

## 2021-07-24 LAB — GLUCOSE, CAPILLARY
Glucose-Capillary: 103 mg/dL — ABNORMAL HIGH (ref 70–99)
Glucose-Capillary: 115 mg/dL — ABNORMAL HIGH (ref 70–99)
Glucose-Capillary: 120 mg/dL — ABNORMAL HIGH (ref 70–99)
Glucose-Capillary: 157 mg/dL — ABNORMAL HIGH (ref 70–99)

## 2021-07-24 LAB — HIV ANTIBODY (ROUTINE TESTING W REFLEX): HIV Screen 4th Generation wRfx: NONREACTIVE

## 2021-07-24 LAB — MAGNESIUM: Magnesium: 2.3 mg/dL (ref 1.7–2.4)

## 2021-07-24 LAB — PROTIME-INR
INR: 3 — ABNORMAL HIGH (ref 0.8–1.2)
Prothrombin Time: 31.4 seconds — ABNORMAL HIGH (ref 11.4–15.2)

## 2021-07-24 MED ORDER — ASCORBIC ACID 500 MG PO TABS
500.0000 mg | ORAL_TABLET | Freq: Every day | ORAL | Status: DC
  Administered 2021-07-24 – 2021-07-29 (×5): 500 mg via ORAL
  Filled 2021-07-24 (×6): qty 1

## 2021-07-24 MED ORDER — CALCITRIOL 0.25 MCG PO CAPS
0.5000 ug | ORAL_CAPSULE | Freq: Every day | ORAL | Status: DC
  Administered 2021-07-26 – 2021-07-29 (×4): 0.5 ug via ORAL
  Filled 2021-07-24 (×6): qty 2

## 2021-07-24 MED ORDER — HEPARIN SODIUM (PORCINE) 1000 UNIT/ML IJ SOLN
INTRAMUSCULAR | Status: AC
  Filled 2021-07-24: qty 1

## 2021-07-24 MED ORDER — CHLORHEXIDINE GLUCONATE CLOTH 2 % EX PADS
6.0000 | MEDICATED_PAD | Freq: Every day | CUTANEOUS | Status: DC
  Administered 2021-07-25 – 2021-07-29 (×5): 6 via TOPICAL

## 2021-07-24 MED ORDER — SODIUM CHLORIDE 0.9 % IV SOLN
INTRAVENOUS | Status: DC | PRN
  Administered 2021-07-24: 250 mL via INTRAVENOUS

## 2021-07-24 MED ORDER — MIDODRINE HCL 5 MG PO TABS
10.0000 mg | ORAL_TABLET | Freq: Three times a day (TID) | ORAL | Status: DC
  Administered 2021-07-24 – 2021-07-29 (×15): 10 mg via ORAL
  Filled 2021-07-24 (×16): qty 2

## 2021-07-24 MED ORDER — WARFARIN SODIUM 2.5 MG PO TABS
2.5000 mg | ORAL_TABLET | Freq: Every day | ORAL | Status: DC
  Filled 2021-07-24: qty 1

## 2021-07-24 MED ORDER — MELATONIN 5 MG PO TABS
5.0000 mg | ORAL_TABLET | Freq: Every day | ORAL | Status: DC
  Administered 2021-07-24: 5 mg via ORAL
  Filled 2021-07-24: qty 1

## 2021-07-24 MED ORDER — FUROSEMIDE 10 MG/ML IJ SOLN
20.0000 mg | Freq: Two times a day (BID) | INTRAMUSCULAR | Status: DC
  Administered 2021-07-24 – 2021-07-28 (×8): 20 mg via INTRAVENOUS
  Filled 2021-07-24 (×10): qty 4

## 2021-07-24 MED ORDER — GABAPENTIN 100 MG PO CAPS
100.0000 mg | ORAL_CAPSULE | Freq: Two times a day (BID) | ORAL | Status: DC
  Administered 2021-07-24 – 2021-07-29 (×11): 100 mg via ORAL
  Filled 2021-07-24 (×11): qty 1

## 2021-07-24 MED ORDER — POLYETHYLENE GLYCOL 3350 17 G PO PACK
17.0000 g | PACK | Freq: Every day | ORAL | Status: DC
  Filled 2021-07-24: qty 1

## 2021-07-24 MED ORDER — LORATADINE 10 MG PO TABS
10.0000 mg | ORAL_TABLET | Freq: Every day | ORAL | Status: DC
  Administered 2021-07-24 – 2021-07-29 (×6): 10 mg via ORAL
  Filled 2021-07-24 (×6): qty 1

## 2021-07-24 MED ORDER — VANCOMYCIN HCL IN DEXTROSE 1-5 GM/200ML-% IV SOLN
1000.0000 mg | INTRAVENOUS | Status: DC
  Administered 2021-07-24 – 2021-07-26 (×2): 1000 mg via INTRAVENOUS
  Filled 2021-07-24 (×3): qty 200

## 2021-07-24 MED ORDER — ASPIRIN EC 81 MG PO TBEC
81.0000 mg | DELAYED_RELEASE_TABLET | Freq: Every day | ORAL | Status: DC
  Administered 2021-07-24 – 2021-07-29 (×5): 81 mg via ORAL
  Filled 2021-07-24 (×5): qty 1

## 2021-07-24 MED ORDER — WARFARIN - PHARMACIST DOSING INPATIENT: Freq: Every day | Status: DC

## 2021-07-24 NOTE — Plan of Care (Signed)
Patient would benefit from a wound care consult.

## 2021-07-24 NOTE — Consult Note (Signed)
Cardiology Consultation:   Patient ID: Linda Quinn; [id number]; 15-Feb-1951   Admit date: 07/23/2021 Date of Consult: 07/24/2021  Primary Care Provider: Merryl Hacker, No Primary Cardiologist: Aurther Loft, MD [facility]) Primary Electrophysiologist:  [facility]   Patient Profile:   Linda Quinn is a 70 y.o. female with a hx of PAF on Coumadin, history of Medtronic ICD in 12/2017 with further details unclear followed by Rosario Adie, ESRD on HD TTS, bedbound status, anemia of chronic disease, history of PE, and obesity who is being seen today for the evaluation of VT with ICD firing at the request of Dr. Damita Dunnings.  History of Present Illness:   Ms. Kubas has previously received her cardiology care from [facility], undergoing Medtronic ICD implantation in 12/2017 with further details being unclear.  No records available in Epic or following a direct query of [facility].  She presented to [facility] on 8/3 with worsening abdominal cellulitis and pain with associated distention and oozing ulcer without associated fever or chills.  She also noted to the ED staff that her ICD has been beeping for the past several days.  In this setting her device was interrogated in the ED, at which time she was noted to have had 1 episode of sustained VT on 7/27 that was successfully treated with shock.  She was also noted to have 4 episodes of NSVT and innumerable episodes of atrial tach/A. fib.  OptiVol readings indicate she has been massively volume overloaded dating back to 11/2020.  Vital signs have been stable.  Labs notable for high-sensitivity troponin 60, Hgb 9.6, WBC 10.9, sodium 134, BUN 28, serum creatinine 4.54, potassium 3.7, magnesium 2.3.  Chest x-ray showed cardiomegaly with pulmonary vascular congestion.  CT abdomen pelvis showed cardiomegaly with trace left pleural effusion as well as diffuse skin thickening with extensive subcutaneous edema consistent with anasarca and a small amount of abdominal pelvic ascites.  She has  undergone hemodialysis earlier today.  Currently, she is without complaint.  She denies any chest pain, worsening dyspnea, palpitations, dizziness, presyncope, or syncope in the setting of the above.  She denies feeling a shock.  She reports that she last underwent hemodialysis 4 months ago and states her dialysis center is in [place].  She indicates her living facility will not take her to follow-up appointments or to dialysis.   Past Medical History:  Diagnosis Date   CHF (congestive heart failure) (HCC)    Diabetes mellitus without complication (HCC)    Paroxysmal atrial fibrillation (HCC)    Renal disorder    Respiratory failure ([place])    Sleep apnea     History reviewed. No pertinent surgical history.   Home Meds: Prior to Admission medications   Medication Sig Start Date End Date Taking? Authorizing Provider  aspirin EC 81 MG tablet Take 81 mg by mouth daily. Swallow whole.   Yes [provider]  calcitRIOL (ROCALTROL) 0.5 MCG capsule Take 0.5 mcg by mouth daily.   Yes [provider]  gabapentin (NEURONTIN) 100 MG capsule Take 100 mg by mouth 2 (two) times daily.   Yes [provider]  insulin glargine (LANTUS) 100 UNIT/ML injection Inject 10 Units into the skin daily.   Yes [provider]  insulin lispro (HUMALOG KWIKPEN) 100 UNIT/ML KwikPen Inject 0-10 Units into the skin 3 (three) times daily.   Yes [provider]  lidocaine (LIDODERM) 5 % Place 1 patch onto the skin daily. Remove & Discard patch within 12 hours or as directed by  MD   Yes [provider]  loratadine (CLARITIN) 10 MG tablet Take 10 mg by mouth daily.   Yes [provider]  melatonin 3 MG TABS tablet Take 3 mg by mouth at bedtime.   Yes [provider]  midodrine (PROAMATINE) 10 MG tablet Take 10 mg by mouth 3 (three) times daily.   Yes [provider]  polyethylene glycol (MIRALAX / GLYCOLAX) 17 g packet Take 17 g by mouth daily.    Yes [provider]  senna-docusate (SENOKOT-S) 8.6-50 MG tablet Take 2 tablets by mouth at bedtime.   Yes [provider]  vitamin C (ASCORBIC ACID) 500 MG tablet Take 500 mg by mouth daily.   Yes [provider]  warfarin (COUMADIN) 2.5 MG tablet Take 2.5 mg by mouth daily.   Yes [provider]    Inpatient Medications: Scheduled Meds:  heparin sodium (porcine)       vitamin C  500 mg Oral Daily   aspirin EC  81 mg Oral Daily   calcitRIOL  0.5 mcg Oral Daily   Chlorhexidine Gluconate Cloth  6 each Topical Daily   furosemide  20 mg Intravenous BID   gabapentin  100 mg Oral BID   insulin aspart  0-5 Units Subcutaneous QHS   insulin aspart  0-9 Units Subcutaneous TID WC   loratadine  10 mg Oral Daily   melatonin  5 mg Oral QHS   midodrine  10 mg Oral TID with meals   polyethylene glycol  17 g Oral Daily   Warfarin - Pharmacist Dosing Inpatient   Does not apply q1600   Continuous Infusions:  cefTRIAXone (ROCEPHIN)  IV     vancomycin     PRN Meds:   Allergies:   Allergies  Allergen Reactions   Ciprofloxacin Other (See Comments)   Duricef [Cefadroxil] Hives   Sulfamethoxazole-Trimethoprim Other (See Comments)    Social History:   Social History   Socioeconomic History   Marital status: Single    Spouse name: Not on file   Number of children: Not on file   Years of education: Not on file   Highest education level: Not on file  Occupational History   Not on file  Tobacco Use   Smoking status: Former    Types: Cigarettes   Smokeless tobacco: Never  Substance and Sexual Activity   Alcohol use: Not Currently   Drug use: Not Currently   Sexual activity: Not Currently  Other Topics Concern   Not on file  Social History Narrative   Not on file   Social Determinants of Health   Financial Resource Strain: Not on file  Food Insecurity: Not on file  Transportation Needs: Not on file  Physical Activity: Not on file  Stress: Not  on file  Social Connections: Not on file  Intimate Partner Violence: Not on file     Family History:   Family History  Problem Relation Age of Onset   Hypertension Mother     ROS:  Review of Systems  Constitutional:  Positive for malaise/fatigue. Negative for chills, diaphoresis, fever and weight loss.  HENT:  Negative for congestion.   Eyes:  Negative for discharge and redness.  Respiratory:  Negative for cough, sputum production, shortness of breath and wheezing.   Cardiovascular:  Negative for chest pain, palpitations, orthopnea, claudication, leg swelling and PND.  Gastrointestinal:  Positive for abdominal pain. Negative for heartburn, nausea and vomiting.  Musculoskeletal:  Negative for falls and myalgias.  Skin:  Negative for rash.  Neurological:  Positive for weakness. Negative for dizziness, tingling, tremors, sensory change, speech change, focal weakness and loss of consciousness.  Endo/Heme/Allergies:  Does not bruise/bleed easily.  Psychiatric/Behavioral:  Negative for substance abuse. The patient is not nervous/anxious.   All other systems reviewed and are negative.    Physical Exam/Data:   Vitals:   07/24/21 1315 07/24/21 1330 07/24/21 1345 07/24/21 1545  BP: 107/79 94/79 (!) 117/57 108/66  Pulse: 70 70 70 70  Resp: (!) 21 (!) '24 19 18  '$ Temp:    98.6 F (37 C)  TempSrc:      SpO2:    100%  Weight:      Height:        Intake/Output Summary (Last 24 hours) at 07/24/2021 1718 Last data filed at 07/24/2021 1345 Gross per 24 hour  Intake 705.73 ml  Output 2500 ml  Net -1794.27 ml   Filed Weights   07/23/21 1304 07/23/21 2308  Weight: 131.1 kg (!) 142.1 kg   Body mass index is 47.63 kg/m.   Physical Exam: General: Well developed, well nourished, in no acute distress. Head: Normocephalic, atraumatic, sclera non-icteric, no xanthomas, nares without discharge.  Neck: Negative for carotid bruits. JVD difficult to assess secondary to body habitus. Lungs:  Diminished breath sounds bilaterally. Breathing is unlabored. Heart: RRR with S1 S2. No murmurs, rubs, or gallops appreciated. Abdomen: Obese, soft, tender, distended with normoactive bowel sounds. No hepatomegaly. No rebound/guarding. No obvious abdominal masses. Msk:  Strength and tone appear normal for age. Extremities: No clubbing or cyanosis.  2-3+ bilateral lower extremity pitting edema. Distal pedal pulses are 2+ and equal bilaterally. Neuro: Alert and oriented X 3. No facial asymmetry. No focal deficit. Moves all extremities spontaneously. Psych:  Responds to questions appropriately with a normal affect.   EKG:  The EKG was personally reviewed and demonstrates: A paced, 70 bpm Telemetry:  Telemetry was personally reviewed and demonstrates: SR  Weights: [employer]   07/23/21 1304 07/23/21 2308  Weight: 131.1 kg (!) 142.1 kg    Relevant CV Studies:  None available for review  Laboratory Data:  Chemistry Recent Labs  Lab 07/23/21 1313 07/24/21 0320  NA 134* 135  K 3.7 3.9  CL 94* 95*  CO2 25 28  GLUCOSE 118* 140*  BUN 28* 31*  CREATININE 4.54* 4.99*  CALCIUM 8.0* 7.8*  GFRNONAA 10* 9*  ANIONGAP 15 12    No results for input(s): PROT, ALBUMIN, AST, ALT, ALKPHOS, BILITOT in the last 168 hours. Hematology Recent Labs  Lab 07/23/21 1313 07/24/21 0320  WBC 10.9* 10.4  RBC 3.60* 3.47*  HGB 9.6* 9.3*  HCT 32.6* 31.6*  MCV 90.6 91.1  MCH 26.7 26.8  MCHC 29.4* 29.4*  RDW 22.6* 22.6*  PLT 228 205   Cardiac EnzymesNo results for input(s): TROPONINI in the last 168 hours. No results for input(s): TROPIPOC in the last 168 hours.  BNPNo results for input(s): BNP, PROBNP in the last 168 hours.  DDimer No results for input(s): DDIMER in the last 168 hours.  Radiology/Studies:  DG Chest 2 View  Result Date: 07/23/2021 IMPRESSION: Cardiomegaly and pulmonary vascular congestion. AICD leads are in unchanged position compared to 07/10/21. Electronically Signed   By:  Ulyses Jarred M.D.   On: 07/23/2021 19:19   CT ABDOMEN PELVIS W CONTRAST  Result Date: 07/23/2021 IMPRESSION: 1. Cardiomegaly with trace left effusion. 2. Atrophic kidneys with no significant excretion of contrast consistent with chronic kidney  disease. 3. Diffuse skin thickening with extensive subcutaneous edema consistent with anasarca. Small amount of abdominopelvic ascites Electronically Signed   By: Donavan Foil M.D.   On: 07/23/2021 19:38    Assessment and Plan:   1.  Sustained VT status post ICD shock with NSVT and AT/AF: -Isolated episode of sustained VT on 7/27 which failed ATP status post successful shock x1 -She has had 4 other episodes of NSVT as well as numerous episodes of AT/AF -Chronic hypotension associated with ESRD on HD requiring midodrine precludes addition of beta-blocker at this time, therefore if she continues to have ectopy antiarrhythmic therapy may be indicated -Potassium normal -Magnesium at goal -Check TSH -Needs to establish with EP locally or find a way to continue her care with [facility]  2.  Volume overload in the context of ESRD on HD: -She is massively volume overloaded on exam with OptiVol exceeding linearity on ICD interrogation report  -Further details of a potential cardiomyopathy are uncertain as there are no available records for review -She reports that she has not been to hemodialysis in 4 months, states that she usually goes to a dialysis center in [place] -She further states that [facility] will not take her to her visits with [employer] or to hemodialysis -It appears that she does receive dialysis from [facility] on [address] which is supervised by [employer] on TTS -She will likely need several days of HD -Obtain echo further recommendations pending -Attempt to get records from [facility]  3.  A. fib: -Device interrogation shows prolonged episodes of A. tach/A. fib, possibly making her A. fib persistent -As above, chronic hypotension requiring  midodrine in the context of ESRD on HD precludes addition of beta-blocker -CHA2DS2-VASc at least 3 (age x 1, DM, vascular disease with history of PE, sex category) -PTA warfarin per pharmacy  4.  Abdominal cellulitis with draining ulcer: -Primary reason for presentation -Management per IM   For questions or updates, please contact [place] Please consult [email or web address] for contact info under Cardiology/STEMI.   Signed, Christell Faith, PA-C [place] Pager: [phone] 07/24/2021, 5:18 PM

## 2021-07-24 NOTE — Progress Notes (Signed)
Pharmacy Antibiotic Note  Linda Quinn is a 70 y.o. female with PMH of diabetes, Atrial Fib on Coumadin, anemia, pacemaker/AICD, and ESRD on HD admitted on 07/23/2021 with abdominal wall cellulitis.  Pharmacy has been consulted for vancomycin dosing. She received a total of 2500 mg IV vancomycin since admission and underwent HD session today, with the next scheduled on 07/26/21  Plan: start vancomycin 1000 mg IV with each HD session Next scheduled HD session 07/26/21 Target vancomycin level 15 - 25 mcg/mL prior to 3rd HD session   Height: '5\' 8"'$  (172.7 cm) Weight: (!) 142.1 kg (313 lb 4.4 oz) IBW/kg (Calculated) : 63.9  Temp (24hrs), Avg:98 F (36.7 C), Min:97.6 F (36.4 C), Max:98.6 F (37 C)  Recent Labs  Lab 07/23/21 1313 07/24/21 0320  WBC 10.9* 10.4  CREATININE 4.54* 4.99*    Estimated Creatinine Clearance: 16.4 mL/min (A) (by C-G formula based on SCr of 4.99 mg/dL (H)).    Allergies  Allergen Reactions   Ciprofloxacin Other (See Comments)   Duricef [Cefadroxil] Hives   Sulfamethoxazole-Trimethoprim Other (See Comments)    Antimicrobials this admission: 08/03 vancomycin >>  08/03 ceftriaxone >>   Microbiology results: 08/03 SARS CoV-2: negative 08/03 influenza A/B: negative   Thank you for allowing pharmacy to be a part of this patient's care.  Dallie Piles 07/24/2021 11:17 AM

## 2021-07-24 NOTE — Progress Notes (Signed)
UF off due to low BP RN and doctor aware.

## 2021-07-24 NOTE — Progress Notes (Signed)
ANTICOAGULATION CONSULT NOTE - Initial Consult  Pharmacy Consult for Wafarin Indication: atrial fibrillation  Allergies  Allergen Reactions   Ciprofloxacin Other (See Comments)   Duricef [Cefadroxil] Hives   Sulfamethoxazole-Trimethoprim Other (See Comments)    Patient Measurements: Height: '5\' 8"'$  (172.7 cm) Weight: (!) 142.1 kg (313 lb 4.4 oz) IBW/kg (Calculated) : 63.9 Heparin Dosing Weight:   Vital Signs: Temp: 97.6 F (36.4 C) (08/03 2308) Temp Source: Oral (08/03 2308) BP: 95/58 (08/03 2308) Pulse Rate: 71 (08/03 2308)  Labs: Recent Labs    07/23/21 1313 07/24/21 0320  HGB 9.6* 9.3*  HCT 32.6* 31.6*  PLT 228 205  LABPROT  --  31.4*  INR  --  3.0*  CREATININE 4.54* 4.99*  TROPONINIHS 60*  --     Estimated Creatinine Clearance: 16.4 mL/min (A) (by C-G formula based on SCr of 4.99 mg/dL (H)).   Medical History: Past Medical History:  Diagnosis Date   CHF (congestive heart failure) ([place])    Diabetes mellitus without complication (HCC)    Paroxysmal atrial fibrillation (HCC)    Renal disorder    Respiratory failure (HCC)    Sleep apnea     Medications:  Medications Prior to Admission  Medication Sig Dispense Refill Last Dose   aspirin EC 81 MG tablet Take 81 mg by mouth daily. Swallow whole.   07/23/2021 at 0900   calcitRIOL (ROCALTROL) 0.5 MCG capsule Take 0.5 mcg by mouth daily.   07/23/2021 at 0900   gabapentin (NEURONTIN) 100 MG capsule Take 100 mg by mouth 2 (two) times daily.   07/23/2021 at 0900   insulin glargine (LANTUS) 100 UNIT/ML injection Inject 10 Units into the skin daily.   07/23/2021 at 0900   insulin lispro (HUMALOG KWIKPEN) 100 UNIT/ML KwikPen Inject 0-10 Units into the skin 3 (three) times daily.   07/23/2021 at 1200   lidocaine (LIDODERM) 5 % Place 1 patch onto the skin daily. Remove & Discard patch within 12 hours or as directed by MD   07/23/2021 at 0900   loratadine (CLARITIN) 10 MG tablet Take 10 mg by mouth daily.   07/23/2021 at 0900    melatonin 3 MG TABS tablet Take 3 mg by mouth at bedtime.   07/22/2021 at 2100   midodrine (PROAMATINE) 10 MG tablet Take 10 mg by mouth 3 (three) times daily.   07/23/2021 at 1200   polyethylene glycol (MIRALAX / GLYCOLAX) 17 g packet Take 17 g by mouth daily.   Past Week at prn   senna-docusate (SENOKOT-S) 8.6-50 MG tablet Take 2 tablets by mouth at bedtime.   07/22/2021 at 2100   vitamin C (ASCORBIC ACID) 500 MG tablet Take 500 mg by mouth daily.   07/23/2021 at 0900   warfarin (COUMADIN) 2.5 MG tablet Take 2.5 mg by mouth daily.   07/22/2021 at 1700    Assessment: Pharmacy consulted to dose warfarin in this 70 year old female admitted with AFib.   Pt was on warfarin 2.5 mg PO daily PTA ,  last dose was on 8/2 @ 1700.   8/4:  INR @ 0320 = 3.0  Goal of Therapy:  INR 2-3   Plan:  8/4:  INR @ 320 = 3.0, last dose of warfarin was on 8/2 @ 1700.  Will resume warfarin 2.5 mg PO daily on 8/4 @ 1800. Will recheck INR on 8/5 with AM labs.   Sheralyn Pinegar D 07/24/2021,4:41 AM

## 2021-07-24 NOTE — TOC Initial Note (Signed)
Transition of Care [facility]) - Initial/Assessment Note    Patient Details  Name: Linda Quinn MRN: [id number] Date of Birth: 30-Nov-1951  [facility] CM/SW Contact:    Beverly Sessions, RN Phone Number: 07/24/2021, 2:07 PM  Clinical Narrative:                      Patient currently under observation status Patient off the floor and unable to review obs letter at this time Per chart review patient is from [facility] Reported that patient A&O x4 for however patient told nursing staff that she lives at home with husband  Only emergency contacted listed as friend attempted to contact number not working  Reached out to [place] at [facility].  Lavella Lemons confirms that patient is from [facility] STR with plans to transition to LTC and can return at discharge.    Requested PT eval   Patient Goals and CMS Choice        Expected Discharge Plan and Services                                                Prior Living Arrangements/Services                       Activities of Daily Living Home Assistive Devices/Equipment: Wheelchair ADL Screening (condition at time of admission) Patient's cognitive ability adequate to safely complete daily activities?: Yes Is the patient deaf or have difficulty hearing?: No Does the patient have difficulty seeing, even when wearing glasses/contacts?: No Does the patient have difficulty concentrating, remembering, or making decisions?: No Patient able to express need for assistance with ADLs?: Yes Does the patient have difficulty dressing or bathing?: No Independently performs ADLs?: Yes (appropriate for developmental age) Does the patient have difficulty walking or climbing stairs?: Yes Weakness of Legs: Both Weakness of Arms/Hands: None  Permission Sought/Granted                  Emotional Assessment              Admission diagnosis:  Cellulitis of  abdominal wall [L03.311] Ventricular tachycardia (HCC) [I47.2] ESRD (end stage renal disease) ([place]) [N18.6] Abdominal wall cellulitis [L03.311] Patient Active Problem List   Diagnosis Date Noted   Pressure injury of skin 07/24/2021   Obesity, Class III, BMI 40-49.9 (morbid obesity) ([place]) 07/23/2021   Pacemaker malfunction, initial encounter 07/23/2021   Cellulitis of abdominal wall 07/23/2021   AF (paroxysmal atrial fibrillation) ([place]) 07/23/2021   Anemia in chronic kidney disease 07/02/2021   Presence of cardiac pacemaker 07/02/2021   Type 2 diabetes mellitus with other diabetic kidney complication ([place]) [id number]   Unspecified systolic (congestive) heart failure ([place]) 07/02/2021   PCP:  Pcp, No Pharmacy:  No Pharmacies Listed    Social Determinants of Health (SDOH) Interventions    Readmission Risk Interventions No flowsheet data found.

## 2021-07-24 NOTE — Progress Notes (Signed)
UF back on pt states she feels fine, RN and doctor aware.

## 2021-07-24 NOTE — Progress Notes (Signed)
Pt completed her UF goal of 3500 with no problems. Pt was alert with no complaints and her BP was 117/57 post tx.

## 2021-07-24 NOTE — Progress Notes (Addendum)
[place] for Wafarin Indication: atrial fibrillation  Patient Measurements: Height: '5\' 8"'$  (172.7 cm) Weight: (!) 142.1 kg (313 lb 4.4 oz) IBW/kg (Calculated) : 63.9  Vital Signs: Temp: 98.6 F (37 C) (08/04 0650) Temp Source: Oral (08/03 2308) BP: 119/92 (08/04 0724) Pulse Rate: 70 (08/04 0724)  Labs: Recent Labs    07/23/21 1313 07/24/21 0320  HGB 9.6* 9.3*  HCT 32.6* 31.6*  PLT 228 205  LABPROT  --  31.4*  INR  --  3.0*  CREATININE 4.54* 4.99*  TROPONINIHS 60*  --      Estimated Creatinine Clearance: 16.4 mL/min (A) (by C-G formula based on SCr of 4.99 mg/dL (H)).   Medical History: Past Medical History:  Diagnosis Date   CHF (congestive heart failure) ([place])    Diabetes mellitus without complication (HCC)    Paroxysmal atrial fibrillation (HCC)    Renal disorder    Respiratory failure (HCC)    Sleep apnea     Medications:  Medications Prior to Admission  Medication Sig Dispense Refill Last Dose   aspirin EC 81 MG tablet Take 81 mg by mouth daily. Swallow whole.   07/23/2021 at 0900   calcitRIOL (ROCALTROL) 0.5 MCG capsule Take 0.5 mcg by mouth daily.   07/23/2021 at 0900   gabapentin (NEURONTIN) 100 MG capsule Take 100 mg by mouth 2 (two) times daily.   07/23/2021 at 0900   insulin glargine (LANTUS) 100 UNIT/ML injection Inject 10 Units into the skin daily.   07/23/2021 at 0900   insulin lispro (HUMALOG KWIKPEN) 100 UNIT/ML KwikPen Inject 0-10 Units into the skin 3 (three) times daily.   07/23/2021 at 1200   lidocaine (LIDODERM) 5 % Place 1 patch onto the skin daily. Remove & Discard patch within 12 hours or as directed by MD   07/23/2021 at 0900   loratadine (CLARITIN) 10 MG tablet Take 10 mg by mouth daily.   07/23/2021 at 0900   melatonin 3 MG TABS tablet Take 3 mg by mouth at bedtime.   07/22/2021 at 2100   midodrine (PROAMATINE) 10 MG tablet Take 10 mg by mouth 3 (three) times daily.   07/23/2021 at 1200   polyethylene glycol  (MIRALAX / GLYCOLAX) 17 g packet Take 17 g by mouth daily.   Past Week at prn   senna-docusate (SENOKOT-S) 8.6-50 MG tablet Take 2 tablets by mouth at bedtime.   07/22/2021 at 2100   vitamin C (ASCORBIC ACID) 500 MG tablet Take 500 mg by mouth daily.   07/23/2021 at 0900   warfarin (COUMADIN) 2.5 MG tablet Take 2.5 mg by mouth daily.   07/22/2021 at 1700    Assessment: 70 y.o. female with PMH of diabetes, Atrial Fib on Coumadin, anemia, pacemaker/AICD, and ESRD on HD admitted on 07/23/2021 with abdominal wall cellulitis. Pharmacy was consulted to dose warfarin in this 70 year old female admitted with AFib. She was on warfarin 2.5 mg PO daily PTA ,  last dose was on 8/2 @ 1700   DDIs: ceftriaxone  Goal of Therapy:  INR 2-3   Plan:  INR is therapeutic but at the upper end of normal Hold warfarin today Re-check INR in am  Dallie Piles 07/24/2021,8:39 AM

## 2021-07-24 NOTE — Progress Notes (Signed)
Patient ID: Linda Quinn, female   DOB: 1950-12-22, 70 y.o.   MRN: [id number] [facility] PROGRESS NOTE  Milley Michno [id number] DOB: September 16, 1951 DOA: 07/23/2021 PCP: Pcp, No  HPI/Subjective: Patient not sure why she is here.  She tells me that she has a wound on her buttock.  She has some discomfort in her abdomen.Admitted with ulcer of the abdomen and abdominal wall cellulitis and also had AICD firing.  Objective: Vitals:   07/24/21 1330 07/24/21 1345  BP: 94/79 (!) 117/57  Pulse: 70 70  Resp: (!) 24 19  Temp:    SpO2:      Intake/Output Summary (Last 24 hours) at 07/24/2021 1459 Last data filed at 07/24/2021 1345 Gross per 24 hour  Intake 705.73 ml  Output 2500 ml  Net -1794.27 ml   Filed Weights   07/23/21 1304 07/23/21 2308  Weight: 131.1 kg (!) 142.1 kg    ROS: Review of Systems  Respiratory:  Negative for shortness of breath.   Cardiovascular:  Negative for chest pain.  Gastrointestinal:  Positive for abdominal pain. Negative for nausea and vomiting.  Exam: Physical Exam HENT:     Head: Normocephalic.     Mouth/Throat:     Pharynx: No oropharyngeal exudate.  Eyes:     General: Lids are normal.     Conjunctiva/sclera: Conjunctivae normal.  Cardiovascular:     Rate and Rhythm: Normal rate and regular rhythm.     Heart sounds: Normal heart sounds, S1 normal and S2 normal.  Pulmonary:     Breath sounds: Examination of the right-lower field reveals decreased breath sounds. Examination of the left-lower field reveals decreased breath sounds. Decreased breath sounds present. No wheezing, rhonchi or rales.  Abdominal:     Palpations: Abdomen is soft.     Tenderness: There is no abdominal tenderness.  Musculoskeletal:     Right upper leg: Swelling present.     Left upper leg: Swelling present.     Right lower leg: Swelling present.     Left lower leg: Swelling present.  Skin:    General: Skin is warm.     Comments: Large abdominal wound.  Darkened skin  lower abdomen moving over to the right abdomen.  Tender spot on right lower rib.  Neurological:     Mental Status: She is alert.     Comments: Answers some yes/no questions.     Data Reviewed: Basic Metabolic Panel: Recent Labs  Lab 07/23/21 1313 07/24/21 0320  NA 134* 135  K 3.7 3.9  CL 94* 95*  CO2 25 28  GLUCOSE 118* 140*  BUN 28* 31*  CREATININE 4.54* 4.99*  CALCIUM 8.0* 7.8*  MG  --  2.3   CBC: Recent Labs  Lab 07/23/21 1313 07/24/21 0320  WBC 10.9* 10.4  NEUTROABS 7.7  --   HGB 9.6* 9.3*  HCT 32.6* 31.6*  MCV 90.6 91.1  PLT 228 205    CBG: Recent Labs  Lab 07/23/21 2355 07/24/21 0724  GLUCAP 103* 115*    Recent Results (from the past 240 hour(s))  Resp Panel by RT-PCR (Flu A&B, Covid) Nasopharyngeal Swab     Status: None   Collection Time: 07/23/21  9:22 PM   Specimen: Nasopharyngeal Swab; Nasopharyngeal(NP) swabs in vial transport medium  Result Value Ref Range Status   SARS Coronavirus 2 by RT PCR NEGATIVE NEGATIVE Final    Comment: (NOTE) SARS-CoV-2 target nucleic acids are NOT DETECTED.  The SARS-CoV-2 RNA is generally detectable in upper respiratory specimens  during the acute phase of infection. The lowest concentration of SARS-CoV-2 viral copies this assay can detect is 138 copies/mL. A negative result does not preclude SARS-Cov-2 infection and should not be used as the sole basis for treatment or other patient management decisions. A negative result may occur with  improper specimen collection/handling, submission of specimen other than nasopharyngeal swab, presence of viral mutation(s) within the areas targeted by this assay, and inadequate number of viral copies(<138 copies/mL). A negative result must be combined with clinical observations, patient history, and epidemiological information. The expected result is Negative.  Fact Sheet for Patients:  EntrepreneurPulse.com.au  Fact Sheet for Healthcare Providers:   IncredibleEmployment.be  This test is no t yet approved or cleared by the [place] FDA and  has been authorized for detection and/or diagnosis of SARS-CoV-2 by FDA under an Emergency Use Authorization (EUA). This EUA will remain  in effect (meaning this test can be used) for the duration of the COVID-19 declaration under Section 564(b)(1) of the Act, 21 U.S.C.section 360bbb-3(b)(1), unless the authorization is terminated  or revoked sooner.       Influenza A by PCR NEGATIVE NEGATIVE Final   Influenza B by PCR NEGATIVE NEGATIVE Final    Comment: (NOTE) The Xpert Xpress SARS-CoV-2/FLU/RSV plus assay is intended as an aid in the diagnosis of influenza from Nasopharyngeal swab specimens and should not be used as a sole basis for treatment. Nasal washings and aspirates are unacceptable for Xpert Xpress SARS-CoV-2/FLU/RSV testing.  Fact Sheet for Patients: EntrepreneurPulse.com.au  Fact Sheet for Healthcare Providers: IncredibleEmployment.be  This test is not yet approved or cleared by the [place] FDA and has been authorized for detection and/or diagnosis of SARS-CoV-2 by FDA under an Emergency Use Authorization (EUA). This EUA will remain in effect (meaning this test can be used) for the duration of the COVID-19 declaration under Section 564(b)(1) of the Act, 21 U.S.C. section 360bbb-3(b)(1), unless the authorization is terminated or revoked.  Performed at [facility], [address] [place], [place] [address]      Studies: DG Chest 2 View  Result Date: 07/23/2021 CLINICAL DATA:  Pacemaker check EXAM: CHEST - 2 VIEW COMPARISON:  07/10/2021 FINDINGS: Left chest wall AICD leads are in unchanged position. Hemodialysis catheter tip is in the proximal right atrium. There is moderate cardiomegaly with left basilar atelectasis/consolidation. Diffuse pulmonary vascular congestion. IMPRESSION: Cardiomegaly and  pulmonary vascular congestion. AICD leads are in unchanged position compared to 07/10/21. Electronically Signed   By: Ulyses Jarred M.D.   On: 07/23/2021 19:19   CT ABDOMEN PELVIS W CONTRAST  Result Date: 07/23/2021 CLINICAL DATA:  Distension epigastric pain EXAM: CT ABDOMEN AND PELVIS WITH CONTRAST TECHNIQUE: Multidetector CT imaging of the abdomen and pelvis was performed using the standard protocol following bolus administration of intravenous contrast. CONTRAST:  140m OMNIPAQUE IOHEXOL 350 MG/ML SOLN COMPARISON:  CT 07/10/2021 FINDINGS: Lower chest: Lung bases demonstrate no acute consolidation. Trace left pleural effusion. Cardiomegaly with partially visualized pacing leads. Hepatobiliary: Dilated IVC. No focal hepatic abnormality. Small gallstones. No biliary dilatation Pancreas: Unremarkable. No pancreatic ductal dilatation or surrounding inflammatory changes. Spleen: Normal in size without focal abnormality. Adrenals/Urinary Tract: Adrenal glands are normal. Slightly atrophic kidneys with extensive vascular calcification. Cysts upper pole right kidney. No significant excretion on delayed images. No hydronephrosis. The bladder is nearly empty Stomach/Bowel: The stomach is nonenlarged. No dilated small bowel. No acute bowel wall thickening. Negative appendix Vascular/Lymphatic: Advanced vascular disease. No aneurysm. Stable left greater than right  iliac nodes. Left common iliac node measures 12 mm. Small bilateral inguinal nodes Reproductive: Status post hysterectomy. No adnexal masses. Other: No free air. Small free fluid in the abdomen and pelvis. Diffuse skin thickening with extensive subcutaneous edema. Musculoskeletal: No acute osseous abnormality. Grade 1 anterolisthesis L5 on S1 with degenerative change IMPRESSION: 1. Cardiomegaly with trace left effusion. 2. Atrophic kidneys with no significant excretion of contrast consistent with chronic kidney disease. 3. Diffuse skin thickening with extensive  subcutaneous edema consistent with anasarca. Small amount of abdominopelvic ascites Electronically Signed   By: Donavan Foil M.D.   On: 07/23/2021 19:38    Scheduled Meds:  heparin sodium (porcine)       vitamin C  500 mg Oral Daily   aspirin EC  81 mg Oral Daily   calcitRIOL  0.5 mcg Oral Daily   Chlorhexidine Gluconate Cloth  6 each Topical Daily   furosemide  20 mg Intravenous BID   gabapentin  100 mg Oral BID   insulin aspart  0-5 Units Subcutaneous QHS   insulin aspart  0-9 Units Subcutaneous TID WC   loratadine  10 mg Oral Daily   melatonin  5 mg Oral QHS   midodrine  10 mg Oral TID with meals   polyethylene glycol  17 g Oral Daily   warfarin  2.5 mg Oral q1800   Warfarin - Pharmacist Dosing Inpatient   Does not apply q1600   Continuous Infusions:  cefTRIAXone (ROCEPHIN)  IV      Assessment/Plan:  Abdominal wall cellulitis with draining ulcer.  We will get wound care consultation.  Continue vancomycin and Rocephin started in the emergency room.  We will get a wound culture. AICD firing for V. tach on 07/16/2021.  Nighttime physician ordered a cardiology consultation.  Echocardiogram ordered. Pulmonary vascular congestion and a dialysis patient.  Had dialysis today.  Limited with medication secondary to hypotension. End-stage renal disease had hemodialysis today Chronic hypotension on midodrine Paroxysmal atrial fibrillation on Coumadin for anticoagulation Morbid obesity with a BMI of 47.63 Stage II sacral decubiti present on admission.  Other decubiti noted below. 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  Sliding scale insulin.  Pressure Injury 07/23/21 Ankle Left;Posterior Stage 2 -  Partial thickness loss of dermis presenting as a shallow open injury with a red, pink wound bed without slough. epithelialized (Active)  07/23/21 2315  Location: Ankle  Location Orientation: Left;Posterior  Staging: Stage 2 -  Partial thickness loss of dermis presenting as a  shallow open injury with a red, pink wound bed without slough.  Wound Description (Comments): epithelialized  Present on Admission: Yes     Pressure Injury Leg Right;Posterior Stage 2 -  Partial thickness loss of dermis presenting as a shallow open injury with a red, pink wound bed without slough. epithelialized (Active)     Location: Leg  Location Orientation: Right;Posterior  Staging: Stage 2 -  Partial thickness loss of dermis presenting as a shallow open injury with a red, pink wound bed without slough.  Wound Description (Comments): epithelialized  Present on Admission: Yes       Code Status:     Code Status Orders  (From admission, onward)           Start     Ordered   07/23/21 2134  Full code  Continuous        07/23/21 2134           Code Status History     This  patient has a current code status but no historical code status.      Family Communication: No working phone number in chart Disposition Plan: Status is: Inpatient  Dispo: The patient is from: Rehab              Anticipated d/c is to: Rehab              Patient currently with an open abdominal wound   Difficult to place patient.  No.  Consultants: Cardiology  Antibiotics: Rocephin and vancomycin  Time spent: 28 minutes  [place]

## 2021-07-24 NOTE — Progress Notes (Signed)
Hemodialysis patient known at [place] 11:30am. Patient recently moved to [place] area from [place] and is currently a resident at [facility]. Patient stated no dialysis concerns at this time. Please contact me with any dialysis placement concerns.  Linda Quinn [facility] [phone]

## 2021-07-24 NOTE — Consult Note (Signed)
[facility]  CONSULT NOTE    Date: 07/24/2021                  Patient Name:  Linda Quinn  MRN: [id number]  DOB: August 27, 1951  Age / Sex: 70 y.o., female         PCP: Pcp, No                 Service Requesting Consult: [place]                 Reason for Consult: End stage renal disease            History of Present Illness: Linda Quinn is a 70 y.o.  female with diabetes, Atrial Fib on Coumadin, anemia, pacemaker/AICD, and ESRD on HD, who was admitted to [facility] on 07/23/2021 for Cellulitis of abdominal wall [L03.311] Ventricular tachycardia ([place]) [I47.2] ESRD (end stage renal disease) ([place]) [N18.6] Abdominal wall cellulitis [id number]   Patient presented to ED from her SNF with reports of a beeping pacemaker. She receives dialysis at [place] (TTS) supervised by [facility]. She denies nausea, vomiting and diarrhea. Denies shortness of breath and cough. Complains of abdominal pain that she states is slightly improved today. Her last dialysis was Tuesday. We have been consulted to manage dialysis needs.   Patient is seen later during dialysis   HEMODIALYSIS FLOWSHEET:  Blood Flow Rate (mL/min): 400 mL/min Arterial Pressure (mmHg): -130 mmHg Venous Pressure (mmHg): 140 mmHg Transmembrane Pressure (mmHg): 70 mmHg Ultrafiltration Rate (mL/min): 1020 mL/min Dialysate Flow Rate (mL/min): 500 ml/min Conductivity: Machine : 13.9 Conductivity: Machine : 13.9 Dialysis Fluid Bolus: Normal Saline Bolus Amount (mL): 250 mL    Medications: Outpatient medications: Medications Prior to Admission  Medication Sig Dispense Refill Last Dose   aspirin EC 81 MG tablet Take 81 mg by mouth daily. Swallow whole.   07/23/2021 at 0900   calcitRIOL (ROCALTROL) 0.5 MCG capsule Take 0.5 mcg by mouth daily.   07/23/2021 at 0900   gabapentin (NEURONTIN) 100 MG capsule Take 100 mg by mouth 2 (two) times daily.   07/23/2021 at 0900   insulin glargine (LANTUS) 100 UNIT/ML injection  Inject 10 Units into the skin daily.   07/23/2021 at 0900   insulin lispro (HUMALOG KWIKPEN) 100 UNIT/ML KwikPen Inject 0-10 Units into the skin 3 (three) times daily.   07/23/2021 at 1200   lidocaine (LIDODERM) 5 % Place 1 patch onto the skin daily. Remove & Discard patch within 12 hours or as directed by MD   07/23/2021 at 0900   loratadine (CLARITIN) 10 MG tablet Take 10 mg by mouth daily.   07/23/2021 at 0900   melatonin 3 MG TABS tablet Take 3 mg by mouth at bedtime.   07/22/2021 at 2100   midodrine (PROAMATINE) 10 MG tablet Take 10 mg by mouth 3 (three) times daily.   07/23/2021 at 1200   polyethylene glycol (MIRALAX / GLYCOLAX) 17 g packet Take 17 g by mouth daily.   Past Week at prn   senna-docusate (SENOKOT-S) 8.6-50 MG tablet Take 2 tablets by mouth at bedtime.   07/22/2021 at 2100   vitamin C (ASCORBIC ACID) 500 MG tablet Take 500 mg by mouth daily.   07/23/2021 at 0900   warfarin (COUMADIN) 2.5 MG tablet Take 2.5 mg by mouth daily.   07/22/2021 at 1700    Current medications: Current Facility-Administered Medications  Medication Dose Route Frequency Provider Last Rate Last Admin   ascorbic acid (  VITAMIN C) tablet 500 mg  500 mg Oral Daily Judd Gaudier V, MD   500 mg at 07/24/21 [id number]   aspirin EC tablet 81 mg  81 mg Oral Daily Athena Masse, MD   81 mg at 07/24/21 [id number]   calcitRIOL (ROCALTROL) capsule 0.5 mcg  0.5 mcg Oral Daily Athena Masse, MD       cefTRIAXone (ROCEPHIN) 1 g in sodium chloride 0.9 % 100 mL IVPB  1 g Intravenous Q24H Athena Masse, MD       Chlorhexidine Gluconate Cloth 2 % PADS 6 each  6 each Topical Daily Athena Masse, MD       furosemide (LASIX) injection 20 mg  20 mg Intravenous BID Judd Gaudier V, MD   20 mg at 07/24/21 [id number]   gabapentin (NEURONTIN) capsule 100 mg  100 mg Oral BID Judd Gaudier V, MD   100 mg at 07/24/21 [id number]   insulin aspart (novoLOG) injection 0-5 Units  0-5 Units Subcutaneous QHS Judd Gaudier V, MD       insulin aspart (novoLOG) injection 0-9  Units  0-9 Units Subcutaneous TID WC Athena Masse, MD       loratadine (CLARITIN) tablet 10 mg  10 mg Oral Daily Judd Gaudier V, MD   10 mg at 07/24/21 [id number]   melatonin tablet 5 mg  5 mg Oral QHS Athena Masse, MD       midodrine (PROAMATINE) tablet 10 mg  10 mg Oral TID with meals Judd Gaudier V, MD   10 mg at 07/24/21 0936   polyethylene glycol (MIRALAX / GLYCOLAX) packet 17 g  17 g Oral Daily Judd Gaudier V, MD       warfarin (COUMADIN) tablet 2.5 mg  2.5 mg Oral q1800 Athena Masse, MD       Warfarin - Pharmacist Dosing Inpatient   Does not apply [id number] Athena Masse, MD          Allergies: Allergies  Allergen Reactions   Ciprofloxacin Other (See Comments)   Duricef [Cefadroxil] Hives   Sulfamethoxazole-Trimethoprim Other (See Comments)      Past Medical History: Past Medical History:  Diagnosis Date   CHF (congestive heart failure) ([place])    Diabetes mellitus without complication (HCC)    Paroxysmal atrial fibrillation (HCC)    Renal disorder    Respiratory failure (HCC)    Sleep apnea      Past Surgical History: History reviewed. No pertinent surgical history.   Family History: History reviewed. No pertinent family history.   Social History: Social History   Socioeconomic History   Marital status: Single    Spouse name: Not on file   Number of children: Not on file   Years of education: Not on file   Highest education level: Not on file  Occupational History   Not on file  Tobacco Use   Smoking status: Former    Types: Cigarettes   Smokeless tobacco: Never  Substance and Sexual Activity   Alcohol use: Not Currently   Drug use: Not Currently   Sexual activity: Not Currently  Other Topics Concern   Not on file  Social History Narrative   Not on file   Social Determinants of Health   Financial Resource Strain: Not on file  Food Insecurity: Not on file  Transportation Needs: Not on file  Physical Activity: Not on file  Stress: Not on  file  Social Connections: Not on file  Intimate Partner Violence:  Not on file     Review of Systems: Review of Systems  Constitutional:  Positive for malaise/fatigue.  Gastrointestinal:  Positive for abdominal pain.  All other systems reviewed and are negative.  Vital Signs: Blood pressure (!) 112/56, pulse 85, temperature 97.6 F (36.4 C), temperature source Oral, resp. rate (!) 23, height '5\' 8"'$  (1.727 m), weight (!) 142.1 kg, SpO2 94 %.  Weight trends: Filed Weights   07/23/21 1304 07/23/21 2308  Weight: 131.1 kg (!) 142.1 kg    Physical Exam: General: NAD, laying in bed  Head: Normocephalic, atraumatic. Moist oral mucosal membranes  Eyes: Anicteric  Neck: Supple, trachea midline  Lungs:  Clear to auscultation  Heart: Regular rate and rhythm  Abdomen:  Soft, nontender, distended, abd wound  Extremities:  3+ peripheral edema.  Neurologic: Alert, moving all four extremities  Skin: No lesions, abd ulcer  Access: Rt Permcath     Lab results: Basic Metabolic Panel: Recent Labs  Lab 07/23/21 1313 07/24/21 0320  NA 134* 135  K 3.7 3.9  CL 94* 95*  CO2 25 28  GLUCOSE 118* 140*  BUN 28* 31*  CREATININE 4.54* 4.99*  CALCIUM 8.0* 7.8*  MG  --  2.3    Liver Function Tests: No results for input(s): AST, ALT, ALKPHOS, BILITOT, PROT, ALBUMIN in the last 168 hours. No results for input(s): LIPASE, AMYLASE in the last 168 hours. No results for input(s): AMMONIA in the last 168 hours.  CBC: Recent Labs  Lab 07/23/21 1313 07/24/21 0320  WBC 10.9* 10.4  NEUTROABS 7.7  --   HGB 9.6* 9.3*  HCT 32.6* 31.6*  MCV 90.6 91.1  PLT 228 205    Cardiac Enzymes: No results for input(s): CKTOTAL, CKMB, CKMBINDEX, TROPONINI in the last 168 hours.  BNP: Invalid input(s): POCBNP  CBG: Recent Labs  Lab 07/23/21 2355 07/24/21 0724  GLUCAP 103* 115*    Microbiology: Results for orders placed or performed during the hospital encounter of 07/23/21  Resp Panel by  RT-PCR (Flu A&B, Covid) Nasopharyngeal Swab     Status: None   Collection Time: 07/23/21  9:22 PM   Specimen: Nasopharyngeal Swab; Nasopharyngeal(NP) swabs in vial transport medium  Result Value Ref Range Status   SARS Coronavirus 2 by RT PCR NEGATIVE NEGATIVE Final    Comment: (NOTE) SARS-CoV-2 target nucleic acids are NOT DETECTED.  The SARS-CoV-2 RNA is generally detectable in upper respiratory specimens during the acute phase of infection. The lowest concentration of SARS-CoV-2 viral copies this assay can detect is 138 copies/mL. A negative result does not preclude SARS-Cov-2 infection and should not be used as the sole basis for treatment or other patient management decisions. A negative result may occur with  improper specimen collection/handling, submission of specimen other than nasopharyngeal swab, presence of viral mutation(s) within the areas targeted by this assay, and inadequate number of viral copies(<138 copies/mL). A negative result must be combined with clinical observations, patient history, and epidemiological information. The expected result is Negative.  Fact Sheet for Patients:  EntrepreneurPulse.com.au  Fact Sheet for Healthcare Providers:  IncredibleEmployment.be  This test is no t yet approved or cleared by the [place] FDA and  has been authorized for detection and/or diagnosis of SARS-CoV-2 by FDA under an Emergency Use Authorization (EUA). This EUA will remain  in effect (meaning this test can be used) for the duration of the COVID-19 declaration under Section 564(b)(1) of the Act, 21 U.S.C.section 360bbb-3(b)(1), unless the authorization is terminated  or revoked  sooner.       Influenza A by PCR NEGATIVE NEGATIVE Final   Influenza B by PCR NEGATIVE NEGATIVE Final    Comment: (NOTE) The Xpert Xpress SARS-CoV-2/FLU/RSV plus assay is intended as an aid in the diagnosis of influenza from Nasopharyngeal swab  specimens and should not be used as a sole basis for treatment. Nasal washings and aspirates are unacceptable for Xpert Xpress SARS-CoV-2/FLU/RSV testing.  Fact Sheet for Patients: EntrepreneurPulse.com.au  Fact Sheet for Healthcare Providers: IncredibleEmployment.be  This test is not yet approved or cleared by the [place] FDA and has been authorized for detection and/or diagnosis of SARS-CoV-2 by FDA under an Emergency Use Authorization (EUA). This EUA will remain in effect (meaning this test can be used) for the duration of the COVID-19 declaration under Section 564(b)(1) of the Act, 21 U.S.C. section 360bbb-3(b)(1), unless the authorization is terminated or revoked.  Performed at [facility], [address] [place], [place] [address]     Coagulation Studies: Recent Labs    07/24/21 0320  LABPROT 31.4*  INR 3.0*    Urinalysis: No results for input(s): COLORURINE, LABSPEC, PHURINE, GLUCOSEU, HGBUR, BILIRUBINUR, KETONESUR, PROTEINUR, UROBILINOGEN, NITRITE, LEUKOCYTESUR in the last 72 hours.  Invalid input(s): APPERANCEUR    Imaging: DG Chest 2 View  Result Date: 07/23/2021 CLINICAL DATA:  Pacemaker check EXAM: CHEST - 2 VIEW COMPARISON:  07/10/2021 FINDINGS: Left chest wall AICD leads are in unchanged position. Hemodialysis catheter tip is in the proximal right atrium. There is moderate cardiomegaly with left basilar atelectasis/consolidation. Diffuse pulmonary vascular congestion. IMPRESSION: Cardiomegaly and pulmonary vascular congestion. AICD leads are in unchanged position compared to 07/10/21. Electronically Signed   By: Ulyses Jarred M.D.   On: 07/23/2021 19:19   CT ABDOMEN PELVIS W CONTRAST  Result Date: 07/23/2021 CLINICAL DATA:  Distension epigastric pain EXAM: CT ABDOMEN AND PELVIS WITH CONTRAST TECHNIQUE: Multidetector CT imaging of the abdomen and pelvis was performed using the standard protocol following  bolus administration of intravenous contrast. CONTRAST:  159m OMNIPAQUE IOHEXOL 350 MG/ML SOLN COMPARISON:  CT 07/10/2021 FINDINGS: Lower chest: Lung bases demonstrate no acute consolidation. Trace left pleural effusion. Cardiomegaly with partially visualized pacing leads. Hepatobiliary: Dilated IVC. No focal hepatic abnormality. Small gallstones. No biliary dilatation Pancreas: Unremarkable. No pancreatic ductal dilatation or surrounding inflammatory changes. Spleen: Normal in size without focal abnormality. Adrenals/Urinary Tract: Adrenal glands are normal. Slightly atrophic kidneys with extensive vascular calcification. Cysts upper pole right kidney. No significant excretion on delayed images. No hydronephrosis. The bladder is nearly empty Stomach/Bowel: The stomach is nonenlarged. No dilated small bowel. No acute bowel wall thickening. Negative appendix Vascular/Lymphatic: Advanced vascular disease. No aneurysm. Stable left greater than right iliac nodes. Left common iliac node measures 12 mm. Small bilateral inguinal nodes Reproductive: Status post hysterectomy. No adnexal masses. Other: No free air. Small free fluid in the abdomen and pelvis. Diffuse skin thickening with extensive subcutaneous edema. Musculoskeletal: No acute osseous abnormality. Grade 1 anterolisthesis L5 on S1 with degenerative change IMPRESSION: 1. Cardiomegaly with trace left effusion. 2. Atrophic kidneys with no significant excretion of contrast consistent with chronic kidney disease. 3. Diffuse skin thickening with extensive subcutaneous edema consistent with anasarca. Small amount of abdominopelvic ascites Electronically Signed   By: KDonavan FoilM.D.   On: 07/23/2021 19:38     Assessment & Plan: Ms. BDedrea Decourcyis a 70y.o.  female with diabetes, Atrial Fib on Coumadin, anemia, pacemaker/AICD, and ESRD on HD, [facility]  for Cellulitis of abdominal wall [L03.311] Ventricular tachycardia (HCC)  [I47.2] ESRD (end stage renal disease) ([place]) [N18.6] Abdominal wall cellulitis [id number]   [facility] [address]/Rt Permcath  Anemia of chronic kidney disease   Lab Results  Component Value Date   HGB 9.3 (L) 07/24/2021   Hgb below target Will monitor for need of EPO  2. End stage renal disease on dialysis Will maintain the outpatient schedule, if possible Received dialysis today. Hypotension reported and managed by turning UF off temporarily. Will continue to monitor. Next treatment scheduled for Saturday  3. Secondary Hyperparathyroidism Lab Results  Component Value Date   CALCIUM 7.8 (L) 07/24/2021  Calcium below target Calcium carbonate BID outpatient   LOS: 0 Lenardo Westwood 8/4/202212:59 PM

## 2021-07-25 ENCOUNTER — Inpatient Hospital Stay (HOSPITAL_COMMUNITY)
Admit: 2021-07-25 | Discharge: 2021-07-25 | Disposition: A | Discharge status: Home / Self Care | Payer: Medicare HMO | Attending: Cardiovascular Disease | Admitting: Cardiovascular Disease

## 2021-07-25 DIAGNOSIS — I5031 Acute diastolic (congestive) heart failure: Secondary | ICD-10-CM | POA: Diagnosis not present

## 2021-07-25 DIAGNOSIS — I5023 Acute on chronic systolic (congestive) heart failure: Secondary | ICD-10-CM

## 2021-07-25 DIAGNOSIS — I959 Hypotension, unspecified: Secondary | ICD-10-CM

## 2021-07-25 DIAGNOSIS — I472 Ventricular tachycardia: Secondary | ICD-10-CM | POA: Diagnosis not present

## 2021-07-25 LAB — GLUCOSE, CAPILLARY
Glucose-Capillary: 127 mg/dL — ABNORMAL HIGH (ref 70–99)
Glucose-Capillary: 126 mg/dL — ABNORMAL HIGH (ref 70–99)
Glucose-Capillary: 196 mg/dL — ABNORMAL HIGH (ref 70–99)

## 2021-07-25 LAB — ECHOCARDIOGRAM COMPLETE
AR max vel: 1.19 cm2
AV Area VTI: 1.24 cm2
AV Area mean vel: 1.23 cm2
AV Mean grad: 2 mmHg
AV Peak grad: 3.9 mmHg
Ao pk vel: 0.99 m/s
Area-P 1/2: 4.12 cm2
Calc EF: 13 %
Height: 68 in
MV VTI: 1.06 cm2
S' Lateral: 5.97 cm
Single Plane A2C EF: 31.1 %
Single Plane A4C EF: 0.6 %
Weight: 5012.38 oz

## 2021-07-25 LAB — PROTIME-INR
INR: 3.9 — ABNORMAL HIGH (ref 0.8–1.2)
Prothrombin Time: 38.1 seconds — ABNORMAL HIGH (ref 11.4–15.2)

## 2021-07-25 MED ORDER — ZOLPIDEM TARTRATE 5 MG PO TABS
5.0000 mg | ORAL_TABLET | Freq: Every evening | ORAL | Status: DC | PRN
  Administered 2021-07-25 – 2021-07-29 (×4): 5 mg via ORAL
  Filled 2021-07-25 (×4): qty 1

## 2021-07-25 MED ORDER — DIPHENHYDRAMINE HCL 50 MG/ML IJ SOLN: 25.0000 mg | Freq: Once | INTRAMUSCULAR | Status: DC

## 2021-07-25 MED ORDER — HEPARIN SODIUM (PORCINE) 1000 UNIT/ML DIALYSIS: 1000.0000 [IU] | INTRAMUSCULAR | Status: DC | PRN

## 2021-07-25 MED ORDER — DIPHENHYDRAMINE HCL 50 MG/ML IJ SOLN
INTRAMUSCULAR | Status: AC
  Administered 2021-07-25: 50 mg
  Filled 2021-07-25: qty 1

## 2021-07-25 MED ORDER — CALCIUM CARBONATE ANTACID 500 MG PO CHEW
2.0000 | CHEWABLE_TABLET | Freq: Two times a day (BID) | ORAL | Status: DC
  Administered 2021-07-25 – 2021-07-28 (×7): 400 mg via ORAL
  Filled 2021-07-25 (×9): qty 2

## 2021-07-25 MED ORDER — VANCOMYCIN HCL IN DEXTROSE 1-5 GM/200ML-% IV SOLN
1000.0000 mg | Freq: Once | INTRAVENOUS | Status: AC
  Administered 2021-07-25: 1000 mg via INTRAVENOUS
  Filled 2021-07-25: qty 200

## 2021-07-25 MED ORDER — PENTAFLUOROPROP-TETRAFLUOROETH EX AERO
1.0000 "application " | INHALATION_SPRAY | CUTANEOUS | Status: DC | PRN
  Filled 2021-07-25: qty 30

## 2021-07-25 MED ORDER — ACETAMINOPHEN 325 MG PO TABS
650.0000 mg | ORAL_TABLET | Freq: Four times a day (QID) | ORAL | Status: DC | PRN
  Administered 2021-07-25 – 2021-07-27 (×4): 650 mg via ORAL
  Filled 2021-07-25 (×5): qty 2

## 2021-07-25 MED ORDER — LIDOCAINE HCL (PF) 1 % IJ SOLN
5.0000 mL | INTRAMUSCULAR | Status: DC | PRN
  Filled 2021-07-25: qty 5

## 2021-07-25 MED ORDER — LIDOCAINE-PRILOCAINE 2.5-2.5 % EX CREA
1.0000 "application " | TOPICAL_CREAM | CUTANEOUS | Status: DC | PRN
  Filled 2021-07-25: qty 5

## 2021-07-25 MED ORDER — SODIUM CHLORIDE 0.9 % IV SOLN: 100.0000 mL | INTRAVENOUS | Status: DC | PRN

## 2021-07-25 MED ORDER — ALTEPLASE 2 MG IJ SOLR: 2.0000 mg | Freq: Once | INTRAMUSCULAR | Status: DC | PRN

## 2021-07-25 NOTE — Consult Note (Signed)
[place] Nurse Consult Note: Reason for Consult: abdominal wound Patient unclear on history of this wound; but it is definitely not an acute wound based on presentation Wound type: Full thickness open wound midline under pendulous hardened pannus with peau d'orange skin over a large portion of the right side of the pannus No surgical history in this area that I can find Pressure Injury POA: NA;  however may be related to pressure from the pannus Measurement:aprx 3cm x 5cm x 0.1cm  Wound bed:100% pale, moist, non granular  Drainage (amount, consistency, odor) moderate; serous? Ascites?  Periwound: intact Dressing procedure/placement/frequency: Add silver hydrofiber for bioburdan and absorption of exudate. Add silver antimicrobial wicking textile under the remainder of the pannus for MASD (moisture associated skin damage).   Orders updated.   Discussed POC with patient and bedside nurse.  Re consult if needed, will not follow at this time. Thanks  Teven Mittman [employer], RN,CWOCN, CNS, [place] [phone])

## 2021-07-25 NOTE — Progress Notes (Signed)
Spoke with Dewayne in central telemetry at 0947 to transfer pt from room 216 to [place].

## 2021-07-25 NOTE — Progress Notes (Signed)
*  PRELIMINARY RESULTS* Echocardiogram 2D Echocardiogram has been performed.  Sherrie Sport 07/25/2021, 9:12 AM

## 2021-07-25 NOTE — Progress Notes (Signed)
Pre HD RN assessment complete. Pt sbp being retaken d/t <90 sbp. Pt requests order for Benadryl d/t itching. See RN assessment.

## 2021-07-25 NOTE — Evaluation (Addendum)
Occupational Therapy Evaluation Patient Details Name: Linda Quinn MRN: [id number] DOB: 05-10-1951 Today's Date: 07/25/2021    History of Present Illness 70 y.o. female with history of PAF on Coumadin, [employer] ICD in 12/2017 with further details unclear followed by Linda Quinn, ESRD on HD [facility], bed-bound status, anemia of chronic disease, history of PE, and obesity who presents initially to the ED due to having her pacemaker beep for the past several days.   Clinical Impression   Linda Quinn was seen for OT evaluation this date. Prior to hospital admission, pt was at [facility] for [place] and reports not getting OOB - prior to rehab pt completed pivot t/fs to/from w/c. Pt lives with husband in home c ramped entrance. Pt presents to acute OT demonstrating impaired ADL performance and functional mobility 2/2 decreased activity tolerance and functional strength/ROM/balance deficits. Pt is eager for therapy and motivated to return to PLOF.  Pt currently requires MAX A don B socks at bed level. MIN A x2 sup<>sit, tolerates >5 min sitting EOB. MIN A + single UE support seated grooming tasks. Standing attempted - unable to clear rear with TOTAL A, requires TOTAL A x2 for lateral scoot t/f. Pt completed supine UE therex as described below with red theraband (provided). Pt would benefit from skilled OT to address noted impairments and functional limitations (see below for any additional details) in order to maximize safety and independence while minimizing falls risk and caregiver burden. Upon hospital discharge, recommend STR to maximize pt safety and return to PLOF.     Follow Up Recommendations  SNF    Equipment Recommendations  Other (comment) (TBD at next venue of care)    Recommendations for Other Services       Precautions / Restrictions Precautions Precautions: Fall Restrictions Weight Bearing Restrictions: No      Mobility Bed Mobility Overal bed mobility: Needs Assistance Bed  Mobility: Supine to Sit;Sit to Supine     Supine to sit: Min assist;+2 for physical assistance;HOB elevated Sit to supine: Mod assist;+2 for physical assistance        Transfers Overall transfer level: Needs assistance   Transfers: Sit to/from Stand;Lateral/Scoot Transfers Sit to Stand: Total assist;From elevated surface        Lateral/Scoot Transfers: Total assist;+2 physical assistance General transfer comment: unable to clear rear with TOTAL A sit<>stand, achieves lateral scoot with TOTAL Ax2    Balance Overall balance assessment: Needs assistance Sitting-balance support: Feet supported;Bilateral upper extremity supported Sitting balance-Leahy Scale: Fair                                     ADL either performed or assessed with clinical judgement   ADL Overall ADL's : Needs assistance/impaired                                       General ADL Comments: MAX A don B socks at bed level. MIN A + single UE support seated grooming tasks. TOTAL A for ADL t/f      Pertinent Vitals/Pain Pain Assessment: Faces Faces Pain Scale: Hurts little more Pain Location: abdomen Pain Descriptors / Indicators: Discomfort;Dull Pain Intervention(s): Limited activity within patient's tolerance;Repositioned     Hand Dominance Right   Extremity/Trunk Assessment Upper Extremity Assessment Upper Extremity Assessment: Generalized weakness   Lower Extremity Assessment Lower Extremity Assessment:  Generalized weakness       Communication Communication Communication: No difficulties   Cognition Arousal/Alertness: Awake/alert Behavior During Therapy: WFL for tasks assessed/performed Overall Cognitive Status: No family/caregiver present to determine baseline cognitive functioning                                 General Comments: Requires cues to state that she is in hospital not rehab   General Comments       Exercises Exercises: Other  exercises;General Upper Extremity General Exercises - Upper Extremity Shoulder Flexion: AROM;Strengthening;Both;10 reps;Supine;Theraband Theraband Level (Shoulder Flexion): Level 2 (Red) Shoulder ABduction: AROM;Strengthening;Both;10 reps;Supine;Theraband Theraband Level (Shoulder Abduction): Level 2 (Red) Shoulder ADduction: AROM;Strengthening;Both;10 reps;Supine;Theraband Theraband Level (Shoulder Adduction): Level 2 (Red) Elbow Flexion: AROM;Strengthening;Both;10 reps;Supine;Theraband Theraband Level (Elbow Flexion): Level 2 (Red) Elbow Extension: AROM;Strengthening;Both;10 reps;Supine;Theraband Theraband Level (Elbow Extension): Level 2 (Red) Other Exercises Other Exercises: Pt educated re: OT role, DME recs, d/c recs, falls prevention, HEP Other Exercises: LBD, sup<>sit, face washing, sit<>stand, lateral scoot   Shoulder Instructions      Home Living Family/patient expects to be discharged to:: Private residence Living Arrangements: Spouse/significant other Available Help at Discharge: Family Type of Home: House Home Access: Ramped entrance                                Prior Functioning/Environment Level of Independence: Needs assistance  Gait / Transfers Assistance Needed: Pt reports prior to STR performed w/c pivot t/f, has not walked since rehab at [facility] (states she is not seeeing therapy and not getting OOB)              OT Problem List: Decreased strength;Decreased range of motion;Decreased activity tolerance;Impaired balance (sitting and/or standing);Decreased knowledge of use of DME or AE;Decreased safety awareness      OT Treatment/Interventions: Self-care/ADL training;Therapeutic exercise;Energy conservation;DME and/or AE instruction;Therapeutic activities;Patient/family education;Balance training    OT Goals(Current goals can be found in the care plan section) Acute Rehab OT Goals Patient Stated Goal: to stand OT Goal Formulation:  With patient Time For Goal Achievement: 08/08/21 Potential to Achieve Goals: Good ADL Goals Pt Will Perform Grooming: bed level;with modified independence Pt Will Perform Upper Body Bathing: with set-up;with supervision;sitting Pt Will Transfer to Toilet: with max assist (rolling at bed level)  OT Frequency: Min 1X/week    AM-PAC OT "6 Clicks" Daily Activity     Outcome Measure Help from another person eating meals?: None Help from another person taking care of personal grooming?: A Little Help from another person toileting, which includes using toliet, bedpan, or urinal?: A Lot Help from another person bathing (including washing, rinsing, drying)?: A Lot Help from another person to put on and taking off regular upper body clothing?: A Lot Help from another person to put on and taking off regular lower body clothing?: A Lot 6 Click Score: 15   End of Session Equipment Utilized During Treatment: Oxygen Nurse Communication: Mobility status  Activity Tolerance: Patient tolerated treatment well Patient left: in bed;with call bell/phone within reach;with bed alarm set  OT Visit Diagnosis: Other abnormalities of gait and mobility (R26.89);Muscle weakness (generalized) (M62.81)                Time: [id number] OT Time Calculation (min): 31 min Charges:  OT General Charges $OT Visit: 1 Visit OT Evaluation $OT Eval Low Complexity: 1 Low OT Treatments $  Self Care/Home Management : 8-22 mins $Therapeutic Exercise: 8-22 mins  Dessie Coma, M.S. OTR/L  07/25/21, 4:42 PM  ascom [phone]

## 2021-07-25 NOTE — Progress Notes (Signed)
PT Cancellation Note  Patient Details Name: Linda Quinn MRN: [id number] DOB: 28-Mar-1951   Cancelled Treatment:    Reason Eval/Treat Not Completed: Patient at procedure or test/unavailable (Pt off floor for dialysis. Will attempt evaluation again at later date/time.)  11:13 AM, 07/25/21 Etta Grandchild, PT, DPT Physical Therapist - [facility]  [phone] ([place])    [place] C 07/25/2021, 11:13 AM

## 2021-07-25 NOTE — Progress Notes (Signed)
[facility]  ROUNDING NOTE   Subjective:   Linda Quinn is a 70 y.o.  female with diabetes, Atrial Fib on Coumadin, anemia, pacemaker/AICD, and ESRD on HD, who was admitted to [facility] on 07/23/2021 for Cellulitis of abdominal wall [L03.311] Ventricular tachycardia ([place]) [I47.2] ESRD (end stage renal disease) ([place]) [N18.6] Abdominal wall cellulitis [id number]  Patient seen during dialysis   HEMODIALYSIS FLOWSHEET:  Blood Flow Rate (mL/min): 400 mL/min Arterial Pressure (mmHg): -180 mmHg Venous Pressure (mmHg): 160 mmHg Transmembrane Pressure (mmHg): 60 mmHg Ultrafiltration Rate (mL/min): 1000 mL/min Dialysate Flow Rate (mL/min): 500 ml/min Conductivity: Machine : 13.8 Conductivity: Machine : 13.8 Dialysis Fluid Bolus: Normal Saline Bolus Amount (mL): 250 mL  Complains of abdominal pain and soreness from cellulitis site No other complaints at this time  Objective:  Vital signs in last 24 hours:  Temp:  [97.4 F (36.3 C)-98.6 F (37 C)] 98 F (36.7 C) (08/05 1019) Pulse Rate:  [68-71] 71 (08/05 1019) Resp:  [15-23] 20 (08/05 1330) BP: (89-128)/(40-85) 95/40 (08/05 1330) SpO2:  [96 %-100 %] 96 % (08/05 0814)  Weight change:  Filed Weights   07/23/21 1304 07/23/21 2308  Weight: 131.1 kg (!) 142.1 kg    Intake/Output: I/O last 3 completed shifts: In: 1030.7 [P.O.:240; I.V.:24.7; IV Piggyback:766] Out: 2500 [Other:2500]   Intake/Output this shift:  Total I/O In: 240 [P.O.:240] Out: -   Physical Exam: General: NAD, laying in bed  Head: Normocephalic, atraumatic. Moist oral mucosal membranes  Eyes: Anicteric  Lungs:  Clear to auscultation, normal effort  Heart: Regular rate and rhythm  Abdomen:  Soft, nontender  Extremities:  2+ peripheral edema.  Neurologic: Nonfocal, moving all four extremities  Skin: No lesions  Access: Rt Permcath    Basic Metabolic Panel: Recent Labs  Lab 07/23/21 1313 07/24/21 0320  NA 134* 135  K 3.7 3.9  CL 94*  95*  CO2 25 28  GLUCOSE 118* 140*  BUN 28* 31*  CREATININE 4.54* 4.99*  CALCIUM 8.0* 7.8*  MG  --  2.3    Liver Function Tests: No results for input(s): AST, ALT, ALKPHOS, BILITOT, PROT, ALBUMIN in the last 168 hours. No results for input(s): LIPASE, AMYLASE in the last 168 hours. No results for input(s): AMMONIA in the last 168 hours.  CBC: Recent Labs  Lab 07/23/21 1313 07/24/21 0320  WBC 10.9* 10.4  NEUTROABS 7.7  --   HGB 9.6* 9.3*  HCT 32.6* 31.6*  MCV 90.6 91.1  PLT 228 205    Cardiac Enzymes: No results for input(s): CKTOTAL, CKMB, CKMBINDEX, TROPONINI in the last 168 hours.  BNP: Invalid input(s): POCBNP  CBG: Recent Labs  Lab 07/23/21 2355 07/24/21 0724 07/24/21 1640 07/24/21 2046 07/25/21 0807  GLUCAP 103* 115* 120* 157* 127*    Microbiology: Results for orders placed or performed during the hospital encounter of 07/23/21  Resp Panel by RT-PCR (Flu A&B, Covid) Nasopharyngeal Swab     Status: None   Collection Time: 07/23/21  9:22 PM   Specimen: Nasopharyngeal Swab; Nasopharyngeal(NP) swabs in vial transport medium  Result Value Ref Range Status   SARS Coronavirus 2 by RT PCR NEGATIVE NEGATIVE Final    Comment: (NOTE) SARS-CoV-2 target nucleic acids are NOT DETECTED.  The SARS-CoV-2 RNA is generally detectable in upper respiratory specimens during the acute phase of infection. The lowest concentration of SARS-CoV-2 viral copies this assay can detect is 138 copies/mL. A negative result does not preclude SARS-Cov-2 infection and should not be used as  the sole basis for treatment or other patient management decisions. A negative result may occur with  improper specimen collection/handling, submission of specimen other than nasopharyngeal swab, presence of viral mutation(s) within the areas targeted by this assay, and inadequate number of viral copies(<138 copies/mL). A negative result must be combined with clinical observations, patient history,  and epidemiological information. The expected result is Negative.  Fact Sheet for Patients:  EntrepreneurPulse.com.au  Fact Sheet for Healthcare Providers:  IncredibleEmployment.be  This test is no t yet approved or cleared by the [place] FDA and  has been authorized for detection and/or diagnosis of SARS-CoV-2 by FDA under an Emergency Use Authorization (EUA). This EUA will remain  in effect (meaning this test can be used) for the duration of the COVID-19 declaration under Section 564(b)(1) of the Act, 21 U.S.C.section 360bbb-3(b)(1), unless the authorization is terminated  or revoked sooner.       Influenza A by PCR NEGATIVE NEGATIVE Final   Influenza B by PCR NEGATIVE NEGATIVE Final    Comment: (NOTE) The Xpert Xpress SARS-CoV-2/FLU/RSV plus assay is intended as an aid in the diagnosis of influenza from Nasopharyngeal swab specimens and should not be used as a sole basis for treatment. Nasal washings and aspirates are unacceptable for Xpert Xpress SARS-CoV-2/FLU/RSV testing.  Fact Sheet for Patients: EntrepreneurPulse.com.au  Fact Sheet for Healthcare Providers: IncredibleEmployment.be  This test is not yet approved or cleared by the [place] FDA and has been authorized for detection and/or diagnosis of SARS-CoV-2 by FDA under an Emergency Use Authorization (EUA). This EUA will remain in effect (meaning this test can be used) for the duration of the COVID-19 declaration under Section 564(b)(1) of the Act, 21 U.S.C. section 360bbb-3(b)(1), unless the authorization is terminated or revoked.  Performed at [facility], [address] [place], [place] [address]     Coagulation Studies: Recent Labs    07/24/21 0320 07/25/21 0452  LABPROT 31.4* 38.1*  INR 3.0* 3.9*    Urinalysis: No results for input(s): COLORURINE, LABSPEC, PHURINE, GLUCOSEU, HGBUR, BILIRUBINUR,  KETONESUR, PROTEINUR, UROBILINOGEN, NITRITE, LEUKOCYTESUR in the last 72 hours.  Invalid input(s): APPERANCEUR    Imaging: DG Chest 2 View  Result Date: 07/23/2021 CLINICAL DATA:  Pacemaker check EXAM: CHEST - 2 VIEW COMPARISON:  07/10/2021 FINDINGS: Left chest wall AICD leads are in unchanged position. Hemodialysis catheter tip is in the proximal right atrium. There is moderate cardiomegaly with left basilar atelectasis/consolidation. Diffuse pulmonary vascular congestion. IMPRESSION: Cardiomegaly and pulmonary vascular congestion. AICD leads are in unchanged position compared to 07/10/21. Electronically Signed   By: Ulyses Jarred M.D.   On: 07/23/2021 19:19   CT ABDOMEN PELVIS W CONTRAST  Result Date: 07/23/2021 CLINICAL DATA:  Distension epigastric pain EXAM: CT ABDOMEN AND PELVIS WITH CONTRAST TECHNIQUE: Multidetector CT imaging of the abdomen and pelvis was performed using the standard protocol following bolus administration of intravenous contrast. CONTRAST:  197m OMNIPAQUE IOHEXOL 350 MG/ML SOLN COMPARISON:  CT 07/10/2021 FINDINGS: Lower chest: Lung bases demonstrate no acute consolidation. Trace left pleural effusion. Cardiomegaly with partially visualized pacing leads. Hepatobiliary: Dilated IVC. No focal hepatic abnormality. Small gallstones. No biliary dilatation Pancreas: Unremarkable. No pancreatic ductal dilatation or surrounding inflammatory changes. Spleen: Normal in size without focal abnormality. Adrenals/Urinary Tract: Adrenal glands are normal. Slightly atrophic kidneys with extensive vascular calcification. Cysts upper pole right kidney. No significant excretion on delayed images. No hydronephrosis. The bladder is nearly empty Stomach/Bowel: The stomach is nonenlarged. No dilated small bowel. No acute bowel  wall thickening. Negative appendix Vascular/Lymphatic: Advanced vascular disease. No aneurysm. Stable left greater than right iliac nodes. Left common iliac node measures 12 mm.  Small bilateral inguinal nodes Reproductive: Status post hysterectomy. No adnexal masses. Other: No free air. Small free fluid in the abdomen and pelvis. Diffuse skin thickening with extensive subcutaneous edema. Musculoskeletal: No acute osseous abnormality. Grade 1 anterolisthesis L5 on S1 with degenerative change IMPRESSION: 1. Cardiomegaly with trace left effusion. 2. Atrophic kidneys with no significant excretion of contrast consistent with chronic kidney disease. 3. Diffuse skin thickening with extensive subcutaneous edema consistent with anasarca. Small amount of abdominopelvic ascites Electronically Signed   By: Donavan Foil M.D.   On: 07/23/2021 19:38   ECHOCARDIOGRAM COMPLETE  Result Date: 07/25/2021    ECHOCARDIOGRAM REPORT   Patient Name:   Linda Quinn Date of Exam: 07/25/2021 Medical Rec #:  [id number]      Height:       68.0 in Accession #:    [id number]     Weight:       313.3 lb Date of Birth:  01-15-1951     BSA:          2.473 m Patient Age:    24 years       BP:           94/54 mmHg Patient Gender: F              HR:           68 bpm. Exam Location:  [facility] Procedure: 2D Echo, Cardiac Doppler and Color Doppler Indications:     CHF-acute diastolic [id number]  History:         Patient has no prior history of Echocardiogram examinations.                  CHF, Pacemaker, Arrythmias:Atrial Fibrillation; Risk                  Factors:Diabetes.  Sonographer:     Sherrie Sport RDCS (AE) Referring Phys:  [place] Diagnosing Phys: Kathlyn Sacramento MD IMPRESSIONS  1. Left ventricular ejection fraction, by estimation, is <20%. The left ventricle has severely decreased function. The left ventricle demonstrates global hypokinesis. The left ventricular internal cavity size was moderately dilated. There is mild left ventricular hypertrophy. Left ventricular diastolic parameters are consistent with Grade II diastolic dysfunction (pseudonormalization).  2. Right ventricular systolic function is moderately  reduced. The right ventricular size is moderately enlarged. There is moderately elevated pulmonary artery systolic pressure.  3. Left atrial size was severely dilated.  4. Right atrial size was moderately dilated.  5. The mitral valve is normal in structure. Mild mitral valve regurgitation. No evidence of mitral stenosis. Moderate mitral annular calcification.  6. Tricuspid valve regurgitation is moderate.  7. The aortic valve is normal in structure. Aortic valve regurgitation is not visualized. Mild to moderate aortic valve sclerosis/calcification is present, without any evidence of aortic stenosis. FINDINGS  Left Ventricle: Left ventricular ejection fraction, by estimation, is <20%. The left ventricle has severely decreased function. The left ventricle demonstrates global hypokinesis. The left ventricular internal cavity size was moderately dilated. There is mild left ventricular hypertrophy. Left ventricular diastolic parameters are consistent with Grade II diastolic dysfunction (pseudonormalization). Right Ventricle: The right ventricular size is moderately enlarged. No increase in right ventricular wall thickness. Right ventricular systolic function is moderately reduced. There is moderately elevated pulmonary artery systolic pressure. The tricuspid  regurgitant velocity is 3.05 m/s,  and with an assumed right atrial pressure of 10 mmHg, the estimated right ventricular systolic pressure is 123456 mmHg. Left Atrium: Left atrial size was severely dilated. Right Atrium: Right atrial size was moderately dilated. Pericardium: There is no evidence of pericardial effusion. Mitral Valve: The mitral valve is normal in structure. Moderate mitral annular calcification. Mild mitral valve regurgitation. No evidence of mitral valve stenosis. MV peak gradient, 2.8 mmHg. The mean mitral valve gradient is 1.0 mmHg. Tricuspid Valve: The tricuspid valve is normal in structure. Tricuspid valve regurgitation is moderate . No evidence  of tricuspid stenosis. Aortic Valve: The aortic valve is normal in structure. Aortic valve regurgitation is not visualized. Mild to moderate aortic valve sclerosis/calcification is present, without any evidence of aortic stenosis. Aortic valve mean gradient measures 2.0 mmHg. Aortic valve peak gradient measures 3.9 mmHg. Aortic valve area, by VTI measures 1.24 cm. Pulmonic Valve: The pulmonic valve was normal in structure. Pulmonic valve regurgitation is not visualized. No evidence of pulmonic stenosis. Aorta: The aortic root is normal in size and structure. Venous: The inferior vena cava was not well visualized. IAS/Shunts: No atrial level shunt detected by color flow Doppler. Additional Comments: A device lead is visualized.  LEFT VENTRICLE PLAX 2D LVIDd:         6.51 cm      Diastology LVIDs:         5.97 cm      LV e' medial:    4.24 cm/s LV PW:         1.32 cm      LV E/e' medial:  21.6 LV IVS:        1.27 cm      LV e' lateral:   8.27 cm/s LVOT diam:     2.10 cm      LV E/e' lateral: 11.1 LV SV:         21 LV SV Index:   8 LVOT Area:     3.46 cm  LV Volumes (MOD) LV vol d, MOD A2C: 206.0 ml LV vol d, MOD A4C: 160.0 ml LV vol s, MOD A2C: 142.0 ml LV vol s, MOD A4C: 159.0 ml LV SV MOD A2C:     64.0 ml LV SV MOD A4C:     160.0 ml LV SV MOD BP:      23.6 ml RIGHT VENTRICLE RV Basal diam:  6.18 cm RV S prime:     8.27 cm/s TAPSE (M-mode): 3.9 cm LEFT ATRIUM              Index       RIGHT ATRIUM           Index LA diam:        6.20 cm  2.51 cm/m  RA Area:     23.40 cm LA Vol (A2C):   167.0 ml 67.52 ml/m RA Volume:   74.50 ml  30.12 ml/m LA Vol (A4C):   95.8 ml  38.74 ml/m LA Biplane Vol: 127.0 ml 51.35 ml/m  AORTIC VALVE                   PULMONIC VALVE AV Area (Vmax):    1.19 cm    PV Vmax:        0.62 m/s AV Area (Vmean):   1.23 cm    PV Peak grad:   1.5 mmHg AV Area (VTI):     1.24 cm    RVOT Peak grad: 2 mmHg AV Vmax:  98.97 cm/s AV Vmean:          65.067 cm/s AV VTI:            0.168 m AV  Peak Grad:      3.9 mmHg AV Mean Grad:      2.0 mmHg LVOT Vmax:         33.90 cm/s LVOT Vmean:        23.200 cm/s LVOT VTI:          0.060 m LVOT/AV VTI ratio: 0.36  AORTA Ao Root diam: 3.20 cm MITRAL VALVE               TRICUSPID VALVE MV Area (PHT): 4.12 cm    TR Peak grad:   37.2 mmHg MV Area VTI:   1.06 cm    TR Vmax:        305.00 cm/s MV Peak grad:  2.8 mmHg MV Mean grad:  1.0 mmHg    SHUNTS MV Vmax:       0.83 m/s    Systemic VTI:  0.06 m MV Vmean:      54.3 cm/s   Systemic Diam: 2.10 cm MV Decel Time: 184 msec MV E velocity: 91.70 cm/s MV A velocity: 48.80 cm/s MV E/A ratio:  1.88 Kathlyn Sacramento MD Electronically signed by Kathlyn Sacramento MD Signature Date/Time: 07/25/2021/12:53:06 PM    Final      Medications:    sodium chloride     sodium chloride     sodium chloride Stopped (07/24/21 2351)   cefTRIAXone (ROCEPHIN)  IV Stopped (07/24/21 2125)   vancomycin Stopped (07/24/21 1827)    vitamin C  500 mg Oral Daily   aspirin EC  81 mg Oral Daily   calcitRIOL  0.5 mcg Oral Daily   Chlorhexidine Gluconate Cloth  6 each Topical Daily   diphenhydrAMINE  25 mg Intravenous Once   furosemide  20 mg Intravenous BID   gabapentin  100 mg Oral BID   insulin aspart  0-5 Units Subcutaneous QHS   insulin aspart  0-9 Units Subcutaneous TID WC   loratadine  10 mg Oral Daily   midodrine  10 mg Oral TID with meals   Warfarin - Pharmacist Dosing Inpatient   Does not apply q1600   sodium chloride, sodium chloride, sodium chloride, acetaminophen, alteplase, heparin, lidocaine (PF), lidocaine-prilocaine, pentafluoroprop-tetrafluoroeth, zolpidem  Assessment/ Plan:  Linda Quinn is a 70 y.o.  female  with diabetes, Atrial Fib on Coumadin, anemia, pacemaker/AICD, and ESRD on HD, who was admitted to [facility] on 07/23/2021 for Cellulitis of abdominal wall [L03.311] Ventricular tachycardia (HCC) [I47.2] ESRD (end stage renal disease) ([place]) [N18.6] Abdominal wall cellulitis [L03.311]   [facility]  Rd/TTS/Rt Permcath  End stage renal disease on dialysis Will maintain the outpatient schedule, if possible Received dialysis yesterday. UF 2.5L achieved. Patient continues to be fluid overloaded. Will dialyze today with a goal of 2.5-3L.  Will continue to monitor. Next treatment scheduled for Saturday  2. Anemia of chronic kidney disease Lab Results  Component Value Date   HGB 9.3 (L) 07/24/2021   Hgb below target Will monitor am labs  3. Secondary Hyperparathyroidism: Lab Results  Component Value Date   CALCIUM 7.8 (L) 07/24/2021   Calcium not at target Calcium carbonate outpatient  Will order during admission    LOS: 1 [place] 8/5/20221:41 PM

## 2021-07-25 NOTE — Progress Notes (Signed)
Pharmacy Antibiotic Note  Marialuisa Kozan is a 70 y.o. female with PMH of diabetes, Atrial Fib on Coumadin, anemia, pacemaker/AICD, and ESRD on HD admitted on 07/23/2021 with abdominal wall cellulitis.  Pharmacy has been consulted for vancomycin dosing. She received a total of 2500 mg IV vancomycin since admission and underwent HD session today, with the next scheduled on 07/26/21  Plan: start vancomycin 1000 mg IV with each HD session Next scheduled HD session 07/26/21 Target vancomycin level 15 - 25 mcg/mL prior to 3rd HD session   Height: '5\' 8"'$  (172.7 cm) Weight: (!) 142.1 kg (313 lb 4.4 oz) IBW/kg (Calculated) : 63.9  Temp (24hrs), Avg:97.7 F (36.5 C), Min:97.4 F (36.3 C), Max:98 F (36.7 C)  Recent Labs  Lab 07/23/21 1313 07/24/21 0320  WBC 10.9* 10.4  CREATININE 4.54* 4.99*     Estimated Creatinine Clearance: 16.4 mL/min (A) (by C-G formula based on SCr of 4.99 mg/dL (H)).    Allergies  Allergen Reactions   Ciprofloxacin Other (See Comments)   Duricef [Cefadroxil] Hives   Sulfamethoxazole-Trimethoprim Other (See Comments)    Antimicrobials this admission: 08/03 vancomycin >>  08/03 ceftriaxone >>   Microbiology results: 08/03 SARS CoV-2: negative 08/03 influenza A/B: negative   Thank you for allowing pharmacy to be a part of this patient's care.  Stetson Pelaez A Treshawn Allen 07/25/2021 4:12 PM

## 2021-07-25 NOTE — Progress Notes (Signed)
[facility] for [place] Indication: atrial fibrillation  Patient Measurements: Height: '5\' 8"'$  (172.7 cm) Weight: (!) 142.1 kg (313 lb 4.4 oz) IBW/kg (Calculated) : 63.9  Vital Signs: Temp: 97.4 F (36.3 C) (08/05 1506) Temp Source: Oral (08/05 1506) BP: 95/55 (08/05 1506) Pulse Rate: 70 (08/05 1506)  Labs: Recent Labs    07/23/21 1313 07/24/21 0320 07/25/21 0452  HGB 9.6* 9.3*  --   HCT 32.6* 31.6*  --   PLT 228 205  --   LABPROT  --  31.4* 38.1*  INR  --  3.0* 3.9*  CREATININE 4.54* 4.99*  --   TROPONINIHS 60*  --   --      Estimated Creatinine Clearance: 16.4 mL/min (Linda) (by C-G formula based on SCr of 4.99 mg/dL (H)).   Medical History: Past Medical History:  Diagnosis Date   CHF (congestive heart failure) ([place])    Diabetes mellitus without complication (HCC)    Paroxysmal atrial fibrillation (HCC)    Renal disorder    Respiratory failure (HCC)    Sleep apnea     Medications:  Medications Prior to Admission  Medication Sig Dispense Refill Last Dose   aspirin EC 81 MG tablet Take 81 mg by mouth daily. Swallow whole.   07/23/2021 at 0900   calcitRIOL (ROCALTROL) 0.5 MCG capsule Take 0.5 mcg by mouth daily.   07/23/2021 at 0900   gabapentin (NEURONTIN) 100 MG capsule Take 100 mg by mouth 2 (two) times daily.   07/23/2021 at 0900   insulin glargine (LANTUS) 100 UNIT/ML injection Inject 10 Units into the skin daily.   07/23/2021 at 0900   insulin lispro (HUMALOG KWIKPEN) 100 UNIT/ML KwikPen Inject 0-10 Units into the skin 3 (three) times daily.   07/23/2021 at 1200   lidocaine (LIDODERM) 5 % Place 1 patch onto the skin daily. Remove & Discard patch within 12 hours or as directed by MD   07/23/2021 at 0900   loratadine (CLARITIN) 10 MG tablet Take 10 mg by mouth daily.   07/23/2021 at 0900   melatonin 3 MG TABS tablet Take 3 mg by mouth at bedtime.   07/22/2021 at 2100   midodrine (PROAMATINE) 10 MG tablet Take 10 mg by mouth 3 (three) times  daily.   07/23/2021 at 1200   polyethylene glycol (MIRALAX / GLYCOLAX) 17 g packet Take 17 g by mouth daily.   Past Week at prn   senna-docusate (SENOKOT-S) 8.6-50 MG tablet Take 2 tablets by mouth at bedtime.   07/22/2021 at 2100   vitamin C (ASCORBIC ACID) 500 MG tablet Take 500 mg by mouth daily.   07/23/2021 at 0900   warfarin (COUMADIN) 2.5 MG tablet Take 2.5 mg by mouth daily.   07/22/2021 at 1700    Assessment: 70 y.o. female with PMH of diabetes, Atrial Fib on Coumadin, anemia, pacemaker/AICD, and ESRD on HD admitted on 07/23/2021 with abdominal wall cellulitis. Pharmacy was consulted to dose warfarin in this 70 year old female admitted with AFib. She was on warfarin 2.5 mg PO daily PTA ,  last dose was on 8/2 @ 1700.  8/4: INR 3.0 8/5: INR 3.9  DDIs: ceftriaxone  Goal of Therapy:  INR 2-3   Plan:  INR supratherapeutic Hold warfarin today Re-check INR in am  Linda Quinn Linda Quinn 07/25/2021,3:49 PM

## 2021-07-25 NOTE — Progress Notes (Signed)
Pt completed tx with no issues with a completed UF goal of 3500 and BP better today than yesterday .Pt was alert and oriented with no complaints.

## 2021-07-25 NOTE — Progress Notes (Signed)
Progress Note  Patient Name: Linda Quinn Date of Encounter: 07/25/2021  Primary Cardiologist: Rosario Adie  Subjective   She is mad this morning, wants to sit on the edge of the bed. No chest pain, dyspnea, palpitations, dizziness, presyncope, or syncope. She reports she has not seen her cardiologist at [facility] in > 12 months. Details of her prior cardiac history remain uncertain as records are not available in Care Everywhere.   Inpatient Medications    Scheduled Meds:  vitamin C  500 mg Oral Daily   aspirin EC  81 mg Oral Daily   calcitRIOL  0.5 mcg Oral Daily   Chlorhexidine Gluconate Cloth  6 each Topical Daily   furosemide  20 mg Intravenous BID   gabapentin  100 mg Oral BID   insulin aspart  0-5 Units Subcutaneous QHS   insulin aspart  0-9 Units Subcutaneous TID WC   loratadine  10 mg Oral Daily   midodrine  10 mg Oral TID with meals   Warfarin - Pharmacist Dosing Inpatient   Does not apply q1600   Continuous Infusions:  sodium chloride     sodium chloride     sodium chloride Stopped (07/24/21 2351)   cefTRIAXone (ROCEPHIN)  IV Stopped (07/24/21 2125)   vancomycin Stopped (07/24/21 1827)   PRN Meds: sodium chloride, sodium chloride, sodium chloride, acetaminophen, alteplase, heparin, lidocaine (PF), lidocaine-prilocaine, pentafluoroprop-tetrafluoroeth, zolpidem   Vital Signs    Vitals:   07/24/21 1545 07/24/21 1947 07/25/21 0506 07/25/21 0814  BP: 108/66 (!) 98/58 103/74 (!) 94/54  Pulse: 70 70 68 68  Resp: '18 20 18   '$ Temp: 98.6 F (37 C) 97.6 F (36.4 C) 97.9 F (36.6 C) (!) 97.4 F (36.3 C)  TempSrc:   Oral   SpO2: 100% 100%  96%  Weight:      Height:        Intake/Output Summary (Last 24 hours) at 07/25/2021 0923 Last data filed at 07/25/2021 0318 Gross per 24 hour  Intake 324.94 ml  Output 2500 ml  Net -2175.06 ml   Filed Weights   07/23/21 1304 07/23/21 2308  Weight: 131.1 kg (!) 142.1 kg    Telemetry    SR - Personally Reviewed  ECG     No new tracings - Personally Reviewed  Physical Exam   GEN: No acute distress.   Neck: JVD elevated to the angle of the mandible. Cardiac: RRR, no murmurs, rubs, or gallops.  Respiratory: Clear to auscultation bilaterally.  GI: Soft, nontender, non-distended.   MS: 2+ bilateral lower extremity pitting edema; No deformity. Neuro:  Alert and oriented x 3; Nonfocal.  Psych: Normal affect.  Labs    Chemistry Recent Labs  Lab 07/23/21 1313 07/24/21 0320  NA 134* 135  K 3.7 3.9  CL 94* 95*  CO2 25 28  GLUCOSE 118* 140*  BUN 28* 31*  CREATININE 4.54* 4.99*  CALCIUM 8.0* 7.8*  GFRNONAA 10* 9*  ANIONGAP 15 12     Hematology Recent Labs  Lab 07/23/21 1313 07/24/21 0320  WBC 10.9* 10.4  RBC 3.60* 3.47*  HGB 9.6* 9.3*  HCT 32.6* 31.6*  MCV 90.6 91.1  MCH 26.7 26.8  MCHC 29.4* 29.4*  RDW 22.6* 22.6*  PLT 228 205    Cardiac EnzymesNo results for input(s): TROPONINI in the last 168 hours. No results for input(s): TROPIPOC in the last 168 hours.   BNPNo results for input(s): BNP, PROBNP in the last 168 hours.   DDimer No results  for input(s): DDIMER in the last 168 hours.   Radiology    DG Chest 2 View  Result Date: 07/23/2021 IMPRESSION: Cardiomegaly and pulmonary vascular congestion. AICD leads are in unchanged position compared to 07/10/21. Electronically Signed   By: Ulyses Jarred M.D.   On: 07/23/2021 19:19   CT ABDOMEN PELVIS W CONTRAST  Result Date: 07/23/2021 IMPRESSION: 1. Cardiomegaly with trace left effusion. 2. Atrophic kidneys with no significant excretion of contrast consistent with chronic kidney disease. 3. Diffuse skin thickening with extensive subcutaneous edema consistent with anasarca. Small amount of abdominopelvic ascites Electronically Signed   By: Donavan Foil M.D.   On: 07/23/2021 19:38    Cardiac Studies   2D echo pending  Patient Profile     70 y.o. female with history of PAF on Coumadin, [employer] ICD in 12/2017 with further details  unclear followed by Rosario Adie, ESRD on HD TTS, bed-bound status, anemia of chronic disease, history of PE, and obesity who we are seeing for evaluation of VT with ICD firing on 07/16/2021.   Assessment & Plan    1. Sustained VT status post ICD shock with NSVT and AT/AF: -Isolated episode of sustained VT on 07/16/2021 s/p successful shock x 1 -Device interrogation also showed 4 episodes of NSVT as well as numerous episodes of AT/AF -Chronic hypotension associated with ESRD on HD requiring midodrine precludes addition of beta blocker at this time -If she has recurrent ectopy/arrhythmia, she may need an antiarrhythmic  -Lytes ok -TSH normal -She declines establishing with EP locally   2. Volume overload and cardiomyopathy in the context of ESRD on HD: -She remains massively volume overloaded, managed by HD -Cardiomyopathy of uncertain etiology at this time -Await echo with further recommendations pending -Escalation of GDMT will be difficult secondary to relative hypotension  -Attempt to obtain records from [place] -She indicates she does not want to follow up in [place] and will reestablish with her cardiologist at [facility]  3. Afib: -Currently in sinus rhythm -Not on AV nodal blocking medications in the setting of hypotension -CHADS2VASc at least 3 -PTA Coumadin  -Overall, prognosis appears poor with limited insight   For questions or updates, please contact [facility] Please consult [email or web address] for contact info under Cardiology/STEMI.    Signed, Christell Faith, PA-C [place] Pager: [phone] 07/25/2021, 9:23 AM

## 2021-07-25 NOTE — Progress Notes (Signed)
OT Cancellation Note  Patient Details Name: Linda Quinn MRN: [id number] DOB: 06-05-1951   Cancelled Treatment:    Reason Eval/Treat Not Completed: Patient at procedure or test/ unavailable. Order received, chart reviewed. Spoke with RN, pt currently at dialysis. Will re-attempt OT evaluation at later date/time as pt is available and medically appropriate.  Hanley Hays, MPH, MS, OTR/L ascom [phone] 07/25/21, 10:44 AM

## 2021-07-25 NOTE — Progress Notes (Addendum)
Patient ID: Linda Quinn, female   DOB: [birth year], 70 y.o.   MRN: [id number] [facility] PROGRESS NOTE  Dafna Kitchell [id number] DOB: [birth year] DOA: 07/23/2021 PCP: Pcp, No  HPI/Subjective: Patient not feeling well because everybody is giving her bad news.  She feels okay.  Not having any shortness of breath currently.  Some abdominal pain.  Still having drainage from her wound.  Admitted with abdominal wall cellulitis.  Also had ventricular tachycardia and firing from her AICD.  Objective: Vitals:   07/25/21 1315 07/25/21 1330  BP: 94/71 (!) 95/40  Pulse:    Resp: 15 20  Temp:    SpO2:      Intake/Output Summary (Last 24 hours) at 07/25/2021 1418 Last data filed at 07/25/2021 1027 Gross per 24 hour  Intake 564.94 ml  Output --  Net 564.94 ml   Filed Weights   07/23/21 1304 07/23/21 2308  Weight: 131.1 kg (!) 142.1 kg    ROS: Review of Systems  Respiratory:  Negative for shortness of breath.   Cardiovascular:  Negative for chest pain.  Gastrointestinal:  Positive for abdominal pain. Negative for nausea and vomiting.  Exam: Physical Exam HENT:     Head: Normocephalic.     Mouth/Throat:     Pharynx: No oropharyngeal exudate.  Eyes:     General: Lids are normal.     Conjunctiva/sclera: Conjunctivae normal.  Cardiovascular:     Rate and Rhythm: Normal rate and regular rhythm.     Heart sounds: Normal heart sounds, S1 normal and S2 normal.  Pulmonary:     Breath sounds: Examination of the right-lower field reveals decreased breath sounds. Examination of the left-lower field reveals decreased breath sounds. Decreased breath sounds present. No wheezing, rhonchi or rales.  Abdominal:     Palpations: Abdomen is soft.     Tenderness: There is abdominal tenderness in the right lower quadrant.  Musculoskeletal:     Right lower leg: Swelling present.     Left lower leg: Swelling present.     Right ankle: Swelling present.     Left ankle: Swelling present.  Skin:     General: Skin is warm.     Comments: Large draining wound on her abdomen.  Neurological:     Mental Status: She is alert and oriented to person, place, and time.     Data Reviewed: Basic Metabolic Panel: Recent Labs  Lab 07/23/21 1313 07/24/21 0320  NA 134* 135  K 3.7 3.9  CL 94* 95*  CO2 25 28  GLUCOSE 118* 140*  BUN 28* 31*  CREATININE 4.54* 4.99*  CALCIUM 8.0* 7.8*  MG  --  2.3   CBC: Recent Labs  Lab 07/23/21 1313 07/24/21 0320  WBC 10.9* 10.4  NEUTROABS 7.7  --   HGB 9.6* 9.3*  HCT 32.6* 31.6*  MCV 90.6 91.1  PLT 228 205     CBG: Recent Labs  Lab 07/23/21 2355 07/24/21 0724 07/24/21 1640 07/24/21 2046 07/25/21 0807  GLUCAP 103* 115* 120* 157* 127*    Recent Results (from the past 240 hour(s))  Resp Panel by RT-PCR (Flu A&B, Covid) Nasopharyngeal Swab     Status: None   Collection Time: 07/23/21  9:22 PM   Specimen: Nasopharyngeal Swab; Nasopharyngeal(NP) swabs in vial transport medium  Result Value Ref Range Status   SARS Coronavirus 2 by RT PCR NEGATIVE NEGATIVE Final    Comment: (NOTE) SARS-CoV-2 target nucleic acids are NOT DETECTED.  The SARS-CoV-2 RNA is generally detectable in upper  respiratory specimens during the acute phase of infection. The lowest concentration of SARS-CoV-2 viral copies this assay can detect is 138 copies/mL. A negative result does not preclude SARS-Cov-2 infection and should not be used as the sole basis for treatment or other patient management decisions. A negative result may occur with  improper specimen collection/handling, submission of specimen other than nasopharyngeal swab, presence of viral mutation(s) within the areas targeted by this assay, and inadequate number of viral copies(<138 copies/mL). A negative result must be combined with clinical observations, patient history, and epidemiological information. The expected result is Negative.  Fact Sheet for Patients:   EntrepreneurPulse.com.au  Fact Sheet for Healthcare Providers:  IncredibleEmployment.be  This test is no t yet approved or cleared by the [place] FDA and  has been authorized for detection and/or diagnosis of SARS-CoV-2 by FDA under an Emergency Use Authorization (EUA). This EUA will remain  in effect (meaning this test can be used) for the duration of the COVID-19 declaration under Section 564(b)(1) of the Act, 21 U.S.C.section 360bbb-3(b)(1), unless the authorization is terminated  or revoked sooner.       Influenza A by PCR NEGATIVE NEGATIVE Final   Influenza B by PCR NEGATIVE NEGATIVE Final    Comment: (NOTE) The Xpert Xpress SARS-CoV-2/FLU/RSV plus assay is intended as an aid in the diagnosis of influenza from Nasopharyngeal swab specimens and should not be used as a sole basis for treatment. Nasal washings and aspirates are unacceptable for Xpert Xpress SARS-CoV-2/FLU/RSV testing.  Fact Sheet for Patients: EntrepreneurPulse.com.au  Fact Sheet for Healthcare Providers: IncredibleEmployment.be  This test is not yet approved or cleared by the [place] FDA and has been authorized for detection and/or diagnosis of SARS-CoV-2 by FDA under an Emergency Use Authorization (EUA). This EUA will remain in effect (meaning this test can be used) for the duration of the COVID-19 declaration under Section 564(b)(1) of the Act, 21 U.S.C. section 360bbb-3(b)(1), unless the authorization is terminated or revoked.  Performed at [facility], [address] [place], [place] [address]      Studies: DG Chest 2 View  Result Date: 07/23/2021 CLINICAL DATA:  Pacemaker check EXAM: CHEST - 2 VIEW COMPARISON:  07/10/2021 FINDINGS: Left chest wall AICD leads are in unchanged position. Hemodialysis catheter tip is in the proximal right atrium. There is moderate cardiomegaly with left basilar  atelectasis/consolidation. Diffuse pulmonary vascular congestion. IMPRESSION: Cardiomegaly and pulmonary vascular congestion. AICD leads are in unchanged position compared to 07/10/21. Electronically Signed   By: Ulyses Jarred M.D.   On: 07/23/2021 19:19   CT ABDOMEN PELVIS W CONTRAST  Result Date: 07/23/2021 CLINICAL DATA:  Distension epigastric pain EXAM: CT ABDOMEN AND PELVIS WITH CONTRAST TECHNIQUE: Multidetector CT imaging of the abdomen and pelvis was performed using the standard protocol following bolus administration of intravenous contrast. CONTRAST:  171m OMNIPAQUE IOHEXOL 350 MG/ML SOLN COMPARISON:  CT 07/10/2021 FINDINGS: Lower chest: Lung bases demonstrate no acute consolidation. Trace left pleural effusion. Cardiomegaly with partially visualized pacing leads. Hepatobiliary: Dilated IVC. No focal hepatic abnormality. Small gallstones. No biliary dilatation Pancreas: Unremarkable. No pancreatic ductal dilatation or surrounding inflammatory changes. Spleen: Normal in size without focal abnormality. Adrenals/Urinary Tract: Adrenal glands are normal. Slightly atrophic kidneys with extensive vascular calcification. Cysts upper pole right kidney. No significant excretion on delayed images. No hydronephrosis. The bladder is nearly empty Stomach/Bowel: The stomach is nonenlarged. No dilated small bowel. No acute bowel wall thickening. Negative appendix Vascular/Lymphatic: Advanced vascular disease. No aneurysm. Stable left greater  than right iliac nodes. Left common iliac node measures 12 mm. Small bilateral inguinal nodes Reproductive: Status post hysterectomy. No adnexal masses. Other: No free air. Small free fluid in the abdomen and pelvis. Diffuse skin thickening with extensive subcutaneous edema. Musculoskeletal: No acute osseous abnormality. Grade 1 anterolisthesis L5 on S1 with degenerative change IMPRESSION: 1. Cardiomegaly with trace left effusion. 2. Atrophic kidneys with no significant excretion  of contrast consistent with chronic kidney disease. 3. Diffuse skin thickening with extensive subcutaneous edema consistent with anasarca. Small amount of abdominopelvic ascites Electronically Signed   By: Donavan Foil M.D.   On: 07/23/2021 19:38   ECHOCARDIOGRAM COMPLETE  Result Date: 07/25/2021    ECHOCARDIOGRAM REPORT   Patient Name:   Linda Quinn Date of Exam: 07/25/2021 Medical Rec #:  [id number]      Height:       68.0 in Accession #:    [id number]     Weight:       313.3 lb Date of Birth:  10-Mar-1951     BSA:          2.473 m Patient Age:    38 years       BP:           94/54 mmHg Patient Gender: F              HR:           68 bpm. Exam Location:  [facility] Procedure: 2D Echo, Cardiac Doppler and Color Doppler Indications:     CHF-acute diastolic [id number]  History:         Patient has no prior history of Echocardiogram examinations.                  CHF, Pacemaker, Arrythmias:Atrial Fibrillation; Risk                  Factors:Diabetes.  Sonographer:     Sherrie Sport RDCS (AE) Referring Phys:  [place] Diagnosing Phys: Kathlyn Sacramento MD IMPRESSIONS  1. Left ventricular ejection fraction, by estimation, is <20%. The left ventricle has severely decreased function. The left ventricle demonstrates global hypokinesis. The left ventricular internal cavity size was moderately dilated. There is mild left ventricular hypertrophy. Left ventricular diastolic parameters are consistent with Grade II diastolic dysfunction (pseudonormalization).  2. Right ventricular systolic function is moderately reduced. The right ventricular size is moderately enlarged. There is moderately elevated pulmonary artery systolic pressure.  3. Left atrial size was severely dilated.  4. Right atrial size was moderately dilated.  5. The mitral valve is normal in structure. Mild mitral valve regurgitation. No evidence of mitral stenosis. Moderate mitral annular calcification.  6. Tricuspid valve regurgitation is moderate.  7. The aortic  valve is normal in structure. Aortic valve regurgitation is not visualized. Mild to moderate aortic valve sclerosis/calcification is present, without any evidence of aortic stenosis. FINDINGS  Left Ventricle: Left ventricular ejection fraction, by estimation, is <20%. The left ventricle has severely decreased function. The left ventricle demonstrates global hypokinesis. The left ventricular internal cavity size was moderately dilated. There is mild left ventricular hypertrophy. Left ventricular diastolic parameters are consistent with Grade II diastolic dysfunction (pseudonormalization). Right Ventricle: The right ventricular size is moderately enlarged. No increase in right ventricular wall thickness. Right ventricular systolic function is moderately reduced. There is moderately elevated pulmonary artery systolic pressure. The tricuspid  regurgitant velocity is 3.05 m/s, and with an assumed right atrial pressure of 10 mmHg, the estimated right  ventricular systolic pressure is 123456 mmHg. Left Atrium: Left atrial size was severely dilated. Right Atrium: Right atrial size was moderately dilated. Pericardium: There is no evidence of pericardial effusion. Mitral Valve: The mitral valve is normal in structure. Moderate mitral annular calcification. Mild mitral valve regurgitation. No evidence of mitral valve stenosis. MV peak gradient, 2.8 mmHg. The mean mitral valve gradient is 1.0 mmHg. Tricuspid Valve: The tricuspid valve is normal in structure. Tricuspid valve regurgitation is moderate . No evidence of tricuspid stenosis. Aortic Valve: The aortic valve is normal in structure. Aortic valve regurgitation is not visualized. Mild to moderate aortic valve sclerosis/calcification is present, without any evidence of aortic stenosis. Aortic valve mean gradient measures 2.0 mmHg. Aortic valve peak gradient measures 3.9 mmHg. Aortic valve area, by VTI measures 1.24 cm. Pulmonic Valve: The pulmonic valve was normal in  structure. Pulmonic valve regurgitation is not visualized. No evidence of pulmonic stenosis. Aorta: The aortic root is normal in size and structure. Venous: The inferior vena cava was not well visualized. IAS/Shunts: No atrial level shunt detected by color flow Doppler. Additional Comments: A device lead is visualized.  LEFT VENTRICLE PLAX 2D LVIDd:         6.51 cm      Diastology LVIDs:         5.97 cm      LV e' medial:    4.24 cm/s LV PW:         1.32 cm      LV E/e' medial:  21.6 LV IVS:        1.27 cm      LV e' lateral:   8.27 cm/s LVOT diam:     2.10 cm      LV E/e' lateral: 11.1 LV SV:         21 LV SV Index:   8 LVOT Area:     3.46 cm  LV Volumes (MOD) LV vol d, MOD A2C: 206.0 ml LV vol d, MOD A4C: 160.0 ml LV vol s, MOD A2C: 142.0 ml LV vol s, MOD A4C: 159.0 ml LV SV MOD A2C:     64.0 ml LV SV MOD A4C:     160.0 ml LV SV MOD BP:      23.6 ml RIGHT VENTRICLE RV Basal diam:  6.18 cm RV S prime:     8.27 cm/s TAPSE (M-mode): 3.9 cm LEFT ATRIUM              Index       RIGHT ATRIUM           Index LA diam:        6.20 cm  2.51 cm/m  RA Area:     23.40 cm LA Vol (A2C):   167.0 ml 67.52 ml/m RA Volume:   74.50 ml  30.12 ml/m LA Vol (A4C):   95.8 ml  38.74 ml/m LA Biplane Vol: 127.0 ml 51.35 ml/m  AORTIC VALVE                   PULMONIC VALVE AV Area (Vmax):    1.19 cm    PV Vmax:        0.62 m/s AV Area (Vmean):   1.23 cm    PV Peak grad:   1.5 mmHg AV Area (VTI):     1.24 cm    RVOT Peak grad: 2 mmHg AV Vmax:           98.97 cm/s AV Vmean:  65.067 cm/s AV VTI:            0.168 m AV Peak Grad:      3.9 mmHg AV Mean Grad:      2.0 mmHg LVOT Vmax:         33.90 cm/s LVOT Vmean:        23.200 cm/s LVOT VTI:          0.060 m LVOT/AV VTI ratio: 0.36  AORTA Ao Root diam: 3.20 cm MITRAL VALVE               TRICUSPID VALVE MV Area (PHT): 4.12 cm    TR Peak grad:   37.2 mmHg MV Area VTI:   1.06 cm    TR Vmax:        305.00 cm/s MV Peak grad:  2.8 mmHg MV Mean grad:  1.0 mmHg    SHUNTS MV Vmax:        0.83 m/s    Systemic VTI:  0.06 m MV Vmean:      54.3 cm/s   Systemic Diam: 2.10 cm MV Decel Time: 184 msec MV E velocity: 91.70 cm/s MV A velocity: 48.80 cm/s MV E/A ratio:  1.88 Kathlyn Sacramento MD Electronically signed by Kathlyn Sacramento MD Signature Date/Time: 07/25/2021/12:53:06 PM    Final     Scheduled Meds:  vitamin C  500 mg Oral Daily   aspirin EC  81 mg Oral Daily   calcitRIOL  0.5 mcg Oral Daily   calcium carbonate  2 tablet Oral BID   Chlorhexidine Gluconate Cloth  6 each Topical Daily   diphenhydrAMINE  25 mg Intravenous Once   furosemide  20 mg Intravenous BID   gabapentin  100 mg Oral BID   insulin aspart  0-5 Units Subcutaneous QHS   insulin aspart  0-9 Units Subcutaneous TID WC   loratadine  10 mg Oral Daily   midodrine  10 mg Oral TID with meals   Warfarin - Pharmacist Dosing Inpatient   Does not apply q1600   Continuous Infusions:  sodium chloride     sodium chloride     sodium chloride Stopped (07/24/21 2351)   cefTRIAXone (ROCEPHIN)  IV Stopped (07/24/21 2125)   vancomycin Stopped (07/24/21 1827)   Brief history: Patient admitted early evening on 07/23/2021 with her pacemaker bedpan and abdominal wall cellulitis.  Past medical history of being bedbound, paroxysmal atrial fibrillation on Coumadin, end-stage renal disease on hemodialysis, AICD, morbid obesity, chronic hypotension, type 2 diabetes with neuropathy.  Patient's AICD was interrogated and found to have ventricular tachycardia.  Limited with medication secondary to hypotension.  Ejection fraction 20%.  Dialysis the only way to manage fluid.  Dialysis 2 days in a row on the fourth and the fifth.  Patient was started on IV antibiotics for abdominal wall cellulitis and open wound.  Wound care consultation.  Assessment/Plan:  Acute on chronic systolic congestive heart failure with anasarca.  EF on echocardiogram 20%.  Limited with medication secondary to hypotension.  Dialysis to manage fluid. AICD firing for  ventricular tachycardia on 07/16/2021.  Again limited with medication secondary to hypotension Abdominal wall cellulitis with draining ulcer.  On vancomycin and Rocephin.  Wound culture pending.  Hopefully can switch over to oral medications soon. End-stage renal disease.  Extra hemodialysis session today. Chronic hypotension on midodrine Paroxysmal atrial fibrillation on Coumadin for anticoagulation Morbid obesity with a BMI of 47.63 Stage II sacral decubitus present on admission.  Other stage II decubiti on  his documented below. 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  Sliding scale insulin.  Last hemoglobin A1c 7.3  Pressure Injury 07/23/21 Ankle Left;Posterior Stage 2 -  Partial thickness loss of dermis presenting as a shallow open injury with a red, pink wound bed without slough. epithelialized (Active)  07/23/21 2315  Location: Ankle  Location Orientation: Left;Posterior  Staging: Stage 2 -  Partial thickness loss of dermis presenting as a shallow open injury with a red, pink wound bed without slough.  Wound Description (Comments): epithelialized  Present on Admission: Yes     Pressure Injury Leg Right;Posterior Stage 2 -  Partial thickness loss of dermis presenting as a shallow open injury with a red, pink wound bed without slough. epithelialized (Active)     Location: Leg  Location Orientation: Right;Posterior  Staging: Stage 2 -  Partial thickness loss of dermis presenting as a shallow open injury with a red, pink wound bed without slough.  Wound Description (Comments): epithelialized  Present on Admission: Yes       Code Status:     Code Status Orders  (From admission, onward)           Start     Ordered   07/23/21 2134  Full code  Continuous        07/23/21 2134           Code Status History     This patient has a current code status but no historical code status.      Family Communication: I do not have a good working phone number in the  computer Disposition Plan: Status is: Inpatient  Dispo: The patient is from: Home              Anticipated d/c is to: Home              Patient currently having an extra dialysis session to fluid overload   Difficult to place patient.  No.  Consultants: Cardiology Nephrology  Antibiotics: Vancomycin Rocephin  Time spent: 27 minutes  [place]

## 2021-07-25 NOTE — TOC Progression Note (Signed)
Transition of Care [facility]) - Progression Note    Patient Details  Name: Linda Quinn MRN: [id number] Date of Birth: 1951-08-02  Transition of Care [facility]) CM/SW [place], LCSW Phone Number: 07/25/2021, 9:14 AM  Clinical Narrative: This CSW working remote today. Called patient in the room and she confirmed she is from [facility]. She stated she has been there about 3 months and is only there for rehab. PT consult entered yesterday. Asked MD to also enter OT consult.   Expected Discharge Plan and Services                                                 Social Determinants of Health (SDOH) Interventions    Readmission Risk Interventions No flowsheet data found.

## 2021-07-26 LAB — CBC
HCT: 33.6 % — ABNORMAL LOW (ref 36.0–46.0)
Hemoglobin: 9.6 g/dL — ABNORMAL LOW (ref 12.0–15.0)
MCH: 26.2 pg (ref 26.0–34.0)
MCHC: 28.6 g/dL — ABNORMAL LOW (ref 30.0–36.0)
MCV: 91.6 fL (ref 80.0–100.0)
Platelets: 221 10*3/uL (ref 150–400)
RBC: 3.67 MIL/uL — ABNORMAL LOW (ref 3.87–5.11)
RDW: 23.2 % — ABNORMAL HIGH (ref 11.5–15.5)
WBC: 9 10*3/uL (ref 4.0–10.5)
nRBC: 0.4 % — ABNORMAL HIGH (ref 0.0–0.2)

## 2021-07-26 LAB — BASIC METABOLIC PANEL
Anion gap: 10 (ref 5–15)
BUN: 14 mg/dL (ref 8–23)
CO2: 28 mmol/L (ref 22–32)
Calcium: 8.2 mg/dL — ABNORMAL LOW (ref 8.9–10.3)
Chloride: 97 mmol/L — ABNORMAL LOW (ref 98–111)
Creatinine, Ser: 3.12 mg/dL — ABNORMAL HIGH (ref 0.44–1.00)
GFR, Estimated: 16 mL/min — ABNORMAL LOW (ref >60)
Glucose, Bld: 143 mg/dL — ABNORMAL HIGH (ref 70–99)
Potassium: 3.3 mmol/L — ABNORMAL LOW (ref 3.5–5.1)
Sodium: 135 mmol/L (ref 135–145)

## 2021-07-26 LAB — GLUCOSE, CAPILLARY
Glucose-Capillary: 162 mg/dL — ABNORMAL HIGH (ref 70–99)
Glucose-Capillary: 127 mg/dL — ABNORMAL HIGH (ref 70–99)
Glucose-Capillary: 131 mg/dL — ABNORMAL HIGH (ref 70–99)
Glucose-Capillary: 124 mg/dL — ABNORMAL HIGH (ref 70–99)

## 2021-07-26 LAB — PROTIME-INR
INR: 3 — ABNORMAL HIGH (ref 0.8–1.2)
Prothrombin Time: 30.9 seconds — ABNORMAL HIGH (ref 11.4–15.2)

## 2021-07-26 LAB — VANCOMYCIN, RANDOM: Vancomycin Rm: 25

## 2021-07-26 MED ORDER — WARFARIN SODIUM 1 MG PO TABS
1.0000 mg | ORAL_TABLET | Freq: Once | ORAL | Status: AC
  Administered 2021-07-26: 1 mg via ORAL
  Filled 2021-07-26: qty 1

## 2021-07-26 MED ORDER — ALBUMIN HUMAN 25 % IV SOLN
25.0000 g | Freq: Once | INTRAVENOUS | Status: AC
Start: 2021-07-26 — End: 2021-07-29
  Administered 2021-07-29: 25 g via INTRAVENOUS

## 2021-07-26 NOTE — Progress Notes (Signed)
Noted decrease in the patients blood pressure, the patient states that she is feeling just fine her pain has subsided and that her blood pressure typically runs low during treatments. RN is at bedside assessing and will document.

## 2021-07-26 NOTE — Progress Notes (Signed)
Pt continues to state that she is feeling "just fine" uf has been turned off at this time due to map dropping below 60 per RN instruction. Will resume uf when patient map has increased.

## 2021-07-26 NOTE — Progress Notes (Signed)
Patient blood pressure and map has increased at this time and uf has been turned back on, will continue to monitor the patient closely.

## 2021-07-26 NOTE — Progress Notes (Signed)
PT Cancellation Note  Patient Details Name: Linda Quinn MRN: [id number] DOB: 1951-07-10   Cancelled Treatment:    Reason Eval/Treat Not Completed: Patient at procedure or test/unavailable PT orders received, chart reviewed. Pt noted to be off the floor for hemodialysis. Will f/u & see pt as able.  Lavone Nian, PT, DPT 07/26/21, 1:38 PM    [name] 07/26/2021, 1:38 PM

## 2021-07-26 NOTE — NC FL2 (Signed)
[place] LEVEL OF CARE SCREENING TOOL     IDENTIFICATION  Patient Name: Linda Quinn Birthdate: 07/10/1951 Sex: female Admission Date (Current Location): 07/23/2021  [facility] and [place] Number:  Engineering geologist and Address:  [facility], [address], [place], [place] [address]      Provider Number: [id number]  Attending Physician Name and Address:  Lorella Nimrod, MD  Relative Name and Phone Number:       Current Level of Care: Hospital Recommended Level of Care: [place] Prior Approval Number:    Date Approved/Denied:   PASRR Number:    Discharge Plan: SNF    Current Diagnoses: Patient Active Problem List   Diagnosis Date Noted   Acute on chronic systolic CHF (congestive heart failure) (HCC)    Hypotension    Pressure injury of skin 07/24/2021   Abdominal wall cellulitis 07/24/2021   Ventricular tachycardia (HCC)    Pulmonary vascular congestion    ESRD (end stage renal disease) ([place])    Obesity, Class III, BMI 40-49.9 (morbid obesity) ([place]) 07/23/2021   Pacemaker malfunction, initial encounter 07/23/2021   Cellulitis of abdominal wall 07/23/2021   AF (paroxysmal atrial fibrillation) ([place]) 07/23/2021   Anemia in chronic kidney disease 07/02/2021   Presence of cardiac pacemaker 07/02/2021   Type 2 diabetes mellitus with diabetic neuropathy, without long-term current use of insulin ([place]) 07/02/2021   Unspecified systolic (congestive) heart failure ([place]) 07/02/2021    Orientation RESPIRATION BLADDER Height & Weight     Self, Time, Situation, Place  Normal Continent Weight: (!) 313 lb 4.4 oz (142.1 kg) Height:  '5\' 8"'$  (172.7 cm)  BEHAVIORAL SYMPTOMS/MOOD NEUROLOGICAL BOWEL NUTRITION STATUS      Incontinent Diet (heart healthy/carb modified, thin liquids, Fluid restriction: 1200 mL Fluid)  AMBULATORY STATUS COMMUNICATION OF NEEDS Skin   Extensive Assist Verbally PU Stage and Appropriate Care, Skin  abrasions (open wound on lower abdomen)   PU Stage 2 Dressing:  (left ankle and right leg)                   Personal Care Assistance Level of Assistance  Dressing, Feeding, Bathing Bathing Assistance: Maximum assistance Feeding assistance: Limited assistance Dressing Assistance: Maximum assistance     Functional Limitations Info  Sight, Speech, Hearing Sight Info: Adequate Hearing Info: Adequate Speech Info: Adequate    SPECIAL CARE FACTORS FREQUENCY  PT (By licensed PT), OT (By licensed OT)     PT Frequency: 5x OT Frequency: 5x            Contractures Contractures Info: Not present    Additional Factors Info  Code Status, Allergies Code Status Info: full code Allergies Info: Ciprofloxacin, Duricef (Cefadroxil), Sulfamethoxazole-trimethoprim           Current Medications (07/26/2021):  This is the current hospital active medication list Current Facility-Administered Medications  Medication Dose Route Frequency Provider Last Rate Last Admin   0.9 %  sodium chloride infusion   Intravenous PRN Loletha Grayer, MD   Stopped at 07/24/21 2351   acetaminophen (TYLENOL) tablet 650 mg  650 mg Oral Q6H PRN Athena Masse, MD   650 mg at 07/25/21 2015   ascorbic acid (VITAMIN C) tablet 500 mg  500 mg Oral Daily Athena Masse, MD   500 mg at 07/26/21 0859   aspirin EC tablet 81 mg  81 mg Oral Daily Athena Masse, MD   81 mg at 07/26/21 0859   calcitRIOL (ROCALTROL)  capsule 0.5 mcg  0.5 mcg Oral Daily Judd Gaudier V, MD   0.5 mcg at 07/26/21 0901   calcium carbonate (TUMS - dosed in mg elemental calcium) chewable tablet 400 mg of elemental calcium  2 tablet Oral BID Colon Flattery, NP   400 mg of elemental calcium at 07/26/21 0859   cefTRIAXone (ROCEPHIN) 1 g in sodium chloride 0.9 % 100 mL IVPB  1 g Intravenous Q24H Judd Gaudier V, MD 200 mL/hr at 07/25/21 2015 1 g at 07/25/21 2015   Chlorhexidine Gluconate Cloth 2 % PADS 6 each  6 each Topical Daily Athena Masse, MD   6 each at 07/26/21 0908   diphenhydrAMINE (BENADRYL) injection 25 mg  25 mg Intravenous Once Colon Flattery, NP       furosemide (LASIX) injection 20 mg  20 mg Intravenous BID Judd Gaudier V, MD   20 mg at 07/25/21 1840   gabapentin (NEURONTIN) capsule 100 mg  100 mg Oral BID Judd Gaudier V, MD   100 mg at 07/26/21 0902   insulin aspart (novoLOG) injection 0-5 Units  0-5 Units Subcutaneous QHS Judd Gaudier V, MD       insulin aspart (novoLOG) injection 0-9 Units  0-9 Units Subcutaneous TID WC Athena Masse, MD   2 Units at 07/26/21 0859   loratadine (CLARITIN) tablet 10 mg  10 mg Oral Daily Judd Gaudier V, MD   10 mg at 07/26/21 0900   midodrine (PROAMATINE) tablet 10 mg  10 mg Oral TID with meals Judd Gaudier V, MD   10 mg at 07/26/21 0900   vancomycin (VANCOCIN) IVPB 1000 mg/200 mL premix  1,000 mg Intravenous Q T,Th,Sa-HD Dallie Piles, [place] at 07/24/21 1827   Warfarin - Pharmacist Dosing Inpatient   Does not apply [id number] Athena Masse, MD       zolpidem [facility]) tablet 5 mg  5 mg Oral QHS PRN Athena Masse, MD   5 mg at 07/25/21 [id number]     Discharge Medications: Please see discharge summary for a list of discharge medications.  Relevant Imaging Results:  Relevant Lab Results:   Additional Information [id number]  Eileen Stanford, LCSW

## 2021-07-26 NOTE — Progress Notes (Signed)
Patient is still experiencing a lower diastolic, she denies symptoms at this time other than abdominal pain. RN reached out to NP and the original goal of 3kg was reduced down to 2 to accommodate decrease in pt bp. Patient  is being monitored closely for further changes in symptoms.

## 2021-07-26 NOTE — Progress Notes (Signed)
Patient ID: Linda Quinn, female   DOB: 18-Jan-1951, 70 y.o.   MRN: [id number] [facility] PROGRESS NOTE  Linda Quinn [id number] DOB: Jan 02, 1951 DOA: 07/23/2021 PCP: Pcp, No  Brief history: Patient admitted early evening on 07/23/2021 with her pacemaker bedpan and abdominal wall cellulitis.  Past medical history of being bedbound, paroxysmal atrial fibrillation on Coumadin, end-stage renal disease on hemodialysis, AICD, morbid obesity, chronic hypotension, type 2 diabetes with neuropathy.  Patient's AICD was interrogated and found to have ventricular tachycardia.  Limited with medication secondary to hypotension.  Ejection fraction 20%.  Dialysis the only way to manage fluid.  Dialysis 2 days in a row on the fourth and the fifth.  Patient was started on IV antibiotics for abdominal wall cellulitis and open wound.  Wound care consultation. PT is recommending SNF placement.  HPI/Subjective: Patient was seen and examined today.  No new complaints.  She was getting her dialysis.  Assessment/Plan:  Acute on chronic systolic congestive heart failure with anasarca.  EF on echocardiogram 20%.  Limited with medication secondary to hypotension.  Dialysis to manage fluid. AICD firing for ventricular tachycardia on 07/16/2021.  Again limited with medication secondary to hypotension.  Cardiology is recommending continuation of amiodarone for ventricular arrhythmia. Abdominal wall cellulitis with draining ulcer.  On vancomycin and Rocephin.  Wound culture ordered but never done. End-stage renal disease.  Extra hemodialysis session today. Chronic hypotension on midodrine Paroxysmal atrial fibrillation on Coumadin for anticoagulation Morbid obesity with a BMI of 47.63 Stage II sacral decubitus present on admission.  Other stage II decubiti on his documented below. 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  Sliding scale insulin.  Last hemoglobin A1c 7.3  Pressure Injury 07/23/21 Ankle  Left;Posterior Stage 2 -  Partial thickness loss of dermis presenting as a shallow open injury with a red, pink wound bed without slough. epithelialized (Active)  07/23/21 2315  Location: Ankle  Location Orientation: Left;Posterior  Staging: Stage 2 -  Partial thickness loss of dermis presenting as a shallow open injury with a red, pink wound bed without slough.  Wound Description (Comments): epithelialized  Present on Admission: Yes     Pressure Injury Leg Right;Posterior Stage 2 -  Partial thickness loss of dermis presenting as a shallow open injury with a red, pink wound bed without slough. epithelialized (Active)     Location: Leg  Location Orientation: Right;Posterior  Staging: Stage 2 -  Partial thickness loss of dermis presenting as a shallow open injury with a red, pink wound bed without slough.  Wound Description (Comments): epithelialized  Present on Admission: Yes   Objective: Vitals:   07/26/21 1255 07/26/21 1427  BP:  (!) 107/57  Pulse: 70 69  Resp: 16 18  Temp:  98.4 F (36.9 C)  SpO2:  (!) 89%    Intake/Output Summary (Last 24 hours) at 07/26/2021 1453 Last data filed at 07/26/2021 1239 Gross per 24 hour  Intake 120 ml  Output 1213 ml  Net -1093 ml    Filed Weights   07/23/21 1304 07/23/21 2308  Weight: 131.1 kg (!) 142.1 kg   Exam: General.  Chronically ill appearing, obese lady, in no acute distress. Pulmonary.  Lungs clear bilaterally, normal respiratory effort. CV.  Regular rate and rhythm, no JVD, rub or murmur. Abdomen.  Soft, nontender, nondistended, BS positive.  Clean bandage on lower abdomen CNS.  Alert and oriented x3.  No focal neurologic deficit. Extremities.  Trace LE edema, no cyanosis. Psychiatry.  Judgment and insight appears  normal.   Data Reviewed: Basic Metabolic Panel: Recent Labs  Lab 07/23/21 1313 07/24/21 0320 07/26/21 0443  NA 134* 135 135  K 3.7 3.9 3.3*  CL 94* 95* 97*  CO2 '25 28 28  '$ GLUCOSE 118* 140* 143*  BUN 28* 31*  14  CREATININE 4.54* 4.99* 3.12*  CALCIUM 8.0* 7.8* 8.2*  MG  --  2.3  --     CBC: Recent Labs  Lab 07/23/21 1313 07/24/21 0320 07/26/21 0443  WBC 10.9* 10.4 9.0  NEUTROABS 7.7  --   --   HGB 9.6* 9.3* 9.6*  HCT 32.6* 31.6* 33.6*  MCV 90.6 91.1 91.6  PLT 228 205 221      CBG: Recent Labs  Lab 07/25/21 0807 07/25/21 1617 07/25/21 2012 07/26/21 0843 07/26/21 1422  GLUCAP 127* 126* 196* 162* 127*     Recent Results (from the past 240 hour(s))  Resp Panel by RT-PCR (Flu A&B, Covid) Nasopharyngeal Swab     Status: None   Collection Time: 07/23/21  9:22 PM   Specimen: Nasopharyngeal Swab; Nasopharyngeal(NP) swabs in vial transport medium  Result Value Ref Range Status   SARS Coronavirus 2 by RT PCR NEGATIVE NEGATIVE Final    Comment: (NOTE) SARS-CoV-2 target nucleic acids are NOT DETECTED.  The SARS-CoV-2 RNA is generally detectable in upper respiratory specimens during the acute phase of infection. The lowest concentration of SARS-CoV-2 viral copies this assay can detect is 138 copies/mL. A negative result does not preclude SARS-Cov-2 infection and should not be used as the sole basis for treatment or other patient management decisions. A negative result may occur with  improper specimen collection/handling, submission of specimen other than nasopharyngeal swab, presence of viral mutation(s) within the areas targeted by this assay, and inadequate number of viral copies(<138 copies/mL). A negative result must be combined with clinical observations, patient history, and epidemiological information. The expected result is Negative.  Fact Sheet for Patients:  EntrepreneurPulse.com.au  Fact Sheet for Healthcare Providers:  IncredibleEmployment.be  This test is no t yet approved or cleared by the [place] FDA and  has been authorized for detection and/or diagnosis of SARS-CoV-2 by FDA under an Emergency Use Authorization  (EUA). This EUA will remain  in effect (meaning this test can be used) for the duration of the COVID-19 declaration under Section 564(b)(1) of the Act, 21 U.S.C.section 360bbb-3(b)(1), unless the authorization is terminated  or revoked sooner.       Influenza A by PCR NEGATIVE NEGATIVE Final   Influenza B by PCR NEGATIVE NEGATIVE Final    Comment: (NOTE) The Xpert Xpress SARS-CoV-2/FLU/RSV plus assay is intended as an aid in the diagnosis of influenza from Nasopharyngeal swab specimens and should not be used as a sole basis for treatment. Nasal washings and aspirates are unacceptable for Xpert Xpress SARS-CoV-2/FLU/RSV testing.  Fact Sheet for Patients: EntrepreneurPulse.com.au  Fact Sheet for Healthcare Providers: IncredibleEmployment.be  This test is not yet approved or cleared by the [place] FDA and has been authorized for detection and/or diagnosis of SARS-CoV-2 by FDA under an Emergency Use Authorization (EUA). This EUA will remain in effect (meaning this test can be used) for the duration of the COVID-19 declaration under Section 564(b)(1) of the Act, 21 U.S.C. section 360bbb-3(b)(1), unless the authorization is terminated or revoked.  Performed at [facility], [address] [place], [place] [address]       Studies: ECHOCARDIOGRAM COMPLETE  Result Date: 07/25/2021    ECHOCARDIOGRAM REPORT   Patient Name:  Sonal Speros Date of Exam: 07/25/2021 Medical Rec #:  [id number]      Height:       68.0 in Accession #:    [id number]     Weight:       313.3 lb Date of Birth:  Nov 26, 1951     BSA:          2.473 m Patient Age:    58 years       BP:           94/54 mmHg Patient Gender: F              HR:           68 bpm. Exam Location:  [facility] Procedure: 2D Echo, Cardiac Doppler and Color Doppler Indications:     CHF-acute diastolic [id number]  History:         Patient has no prior history of Echocardiogram examinations.                   CHF, Pacemaker, Arrythmias:Atrial Fibrillation; Risk                  Factors:Diabetes.  Sonographer:     Sherrie Sport RDCS (AE) Referring Phys:  [place] Diagnosing Phys: Kathlyn Sacramento MD IMPRESSIONS  1. Left ventricular ejection fraction, by estimation, is <20%. The left ventricle has severely decreased function. The left ventricle demonstrates global hypokinesis. The left ventricular internal cavity size was moderately dilated. There is mild left ventricular hypertrophy. Left ventricular diastolic parameters are consistent with Grade II diastolic dysfunction (pseudonormalization).  2. Right ventricular systolic function is moderately reduced. The right ventricular size is moderately enlarged. There is moderately elevated pulmonary artery systolic pressure.  3. Left atrial size was severely dilated.  4. Right atrial size was moderately dilated.  5. The mitral valve is normal in structure. Mild mitral valve regurgitation. No evidence of mitral stenosis. Moderate mitral annular calcification.  6. Tricuspid valve regurgitation is moderate.  7. The aortic valve is normal in structure. Aortic valve regurgitation is not visualized. Mild to moderate aortic valve sclerosis/calcification is present, without any evidence of aortic stenosis. FINDINGS  Left Ventricle: Left ventricular ejection fraction, by estimation, is <20%. The left ventricle has severely decreased function. The left ventricle demonstrates global hypokinesis. The left ventricular internal cavity size was moderately dilated. There is mild left ventricular hypertrophy. Left ventricular diastolic parameters are consistent with Grade II diastolic dysfunction (pseudonormalization). Right Ventricle: The right ventricular size is moderately enlarged. No increase in right ventricular wall thickness. Right ventricular systolic function is moderately reduced. There is moderately elevated pulmonary artery systolic pressure. The tricuspid  regurgitant  velocity is 3.05 m/s, and with an assumed right atrial pressure of 10 mmHg, the estimated right ventricular systolic pressure is 123456 mmHg. Left Atrium: Left atrial size was severely dilated. Right Atrium: Right atrial size was moderately dilated. Pericardium: There is no evidence of pericardial effusion. Mitral Valve: The mitral valve is normal in structure. Moderate mitral annular calcification. Mild mitral valve regurgitation. No evidence of mitral valve stenosis. MV peak gradient, 2.8 mmHg. The mean mitral valve gradient is 1.0 mmHg. Tricuspid Valve: The tricuspid valve is normal in structure. Tricuspid valve regurgitation is moderate . No evidence of tricuspid stenosis. Aortic Valve: The aortic valve is normal in structure. Aortic valve regurgitation is not visualized. Mild to moderate aortic valve sclerosis/calcification is present, without any evidence of aortic stenosis. Aortic valve mean gradient measures 2.0 mmHg. Aortic valve peak  gradient measures 3.9 mmHg. Aortic valve area, by VTI measures 1.24 cm. Pulmonic Valve: The pulmonic valve was normal in structure. Pulmonic valve regurgitation is not visualized. No evidence of pulmonic stenosis. Aorta: The aortic root is normal in size and structure. Venous: The inferior vena cava was not well visualized. IAS/Shunts: No atrial level shunt detected by color flow Doppler. Additional Comments: A device lead is visualized.  LEFT VENTRICLE PLAX 2D LVIDd:         6.51 cm      Diastology LVIDs:         5.97 cm      LV e' medial:    4.24 cm/s LV PW:         1.32 cm      LV E/e' medial:  21.6 LV IVS:        1.27 cm      LV e' lateral:   8.27 cm/s LVOT diam:     2.10 cm      LV E/e' lateral: 11.1 LV SV:         21 LV SV Index:   8 LVOT Area:     3.46 cm  LV Volumes (MOD) LV vol d, MOD A2C: 206.0 ml LV vol d, MOD A4C: 160.0 ml LV vol s, MOD A2C: 142.0 ml LV vol s, MOD A4C: 159.0 ml LV SV MOD A2C:     64.0 ml LV SV MOD A4C:     160.0 ml LV SV MOD BP:      23.6 ml RIGHT  VENTRICLE RV Basal diam:  6.18 cm RV S prime:     8.27 cm/s TAPSE (M-mode): 3.9 cm LEFT ATRIUM              Index       RIGHT ATRIUM           Index LA diam:        6.20 cm  2.51 cm/m  RA Area:     23.40 cm LA Vol (A2C):   167.0 ml 67.52 ml/m RA Volume:   74.50 ml  30.12 ml/m LA Vol (A4C):   95.8 ml  38.74 ml/m LA Biplane Vol: 127.0 ml 51.35 ml/m  AORTIC VALVE                   PULMONIC VALVE AV Area (Vmax):    1.19 cm    PV Vmax:        0.62 m/s AV Area (Vmean):   1.23 cm    PV Peak grad:   1.5 mmHg AV Area (VTI):     1.24 cm    RVOT Peak grad: 2 mmHg AV Vmax:           98.97 cm/s AV Vmean:          65.067 cm/s AV VTI:            0.168 m AV Peak Grad:      3.9 mmHg AV Mean Grad:      2.0 mmHg LVOT Vmax:         33.90 cm/s LVOT Vmean:        23.200 cm/s LVOT VTI:          0.060 m LVOT/AV VTI ratio: 0.36  AORTA Ao Root diam: 3.20 cm MITRAL VALVE               TRICUSPID VALVE MV Area (PHT): 4.12 cm    TR Peak grad:   37.2 mmHg  MV Area VTI:   1.06 cm    TR Vmax:        305.00 cm/s MV Peak grad:  2.8 mmHg MV Mean grad:  1.0 mmHg    SHUNTS MV Vmax:       0.83 m/s    Systemic VTI:  0.06 m MV Vmean:      54.3 cm/s   Systemic Diam: 2.10 cm MV Decel Time: 184 msec MV E velocity: 91.70 cm/s MV A velocity: 48.80 cm/s MV E/A ratio:  1.88 Kathlyn Sacramento MD Electronically signed by Kathlyn Sacramento MD Signature Date/Time: 07/25/2021/12:53:06 PM    Final     Scheduled Meds:  vitamin C  500 mg Oral Daily   aspirin EC  81 mg Oral Daily   calcitRIOL  0.5 mcg Oral Daily   calcium carbonate  2 tablet Oral BID   Chlorhexidine Gluconate Cloth  6 each Topical Daily   diphenhydrAMINE  25 mg Intravenous Once   furosemide  20 mg Intravenous BID   gabapentin  100 mg Oral BID   insulin aspart  0-5 Units Subcutaneous QHS   insulin aspart  0-9 Units Subcutaneous TID WC   loratadine  10 mg Oral Daily   midodrine  10 mg Oral TID with meals   warfarin  1 mg Oral ONCE-1600   Warfarin - Pharmacist Dosing Inpatient   Does not  apply q1600   Continuous Infusions:  sodium chloride Stopped (07/24/21 2351)   albumin human     cefTRIAXone (ROCEPHIN)  IV 1 g (07/25/21 2015)   vancomycin 1,000 mg (07/26/21 1451)     Code Status:     Code Status Orders  (From admission, onward)           Start     Ordered   07/23/21 2134  Full code  Continuous        07/23/21 2134           Code Status History     This patient has a current code status but no historical code status.      Family Communication: I do not have a good working phone number in the computer Disposition Plan: Status is: Inpatient  Dispo: The patient is from: Home              Anticipated d/c is to: SNF               Difficult to place patient.  No.  Consultants: Cardiology Nephrology  Antibiotics: Vancomycin Rocephin  Time spent: 37 minutes.  This record has been created using Systems analyst. Errors have been sought and corrected,but may not always be located. Such creation errors do not reflect on the standard of care.   [place] Hospitalist

## 2021-07-26 NOTE — Progress Notes (Signed)
[place] for [place] Indication: atrial fibrillation  Patient Measurements: Height: '5\' 8"'$  (172.7 cm) Weight: (!) 142.1 kg (313 lb 4.4 oz) IBW/kg (Calculated) : 63.9  Vital Signs: Temp: 98.1 F (36.7 C) (08/06 0931) Temp Source: Oral (08/06 0931) BP: 115/59 (08/06 1003) Pulse Rate: 69 (08/06 1004)  Labs: Recent Labs    07/23/21 1313 07/24/21 0320 07/25/21 0452 07/26/21 0443  HGB 9.6* 9.3*  --  9.6*  HCT 32.6* 31.6*  --  33.6*  PLT 228 205  --  221  LABPROT  --  31.4* 38.1* 30.9*  INR  --  3.0* 3.9* 3.0*  CREATININE 4.54* 4.99*  --  3.12*  TROPONINIHS 60*  --   --   --      Estimated Creatinine Clearance: 26.3 mL/min (A) (by C-G formula based on SCr of 3.12 mg/dL (H)).   Medical History: Past Medical History:  Diagnosis Date   CHF (congestive heart failure) ([place])    Diabetes mellitus without complication (HCC)    Paroxysmal atrial fibrillation (HCC)    Renal disorder    Respiratory failure ([place])    Sleep apnea      Assessment: 70 y.o. female with PMH of diabetes, Atrial Fib on Coumadin, anemia, pacemaker/AICD, and ESRD on HD admitted on 07/23/2021 with abdominal wall cellulitis. Pharmacy was consulted to dose warfarin in this 70 year old female admitted with AFib. She was on warfarin 2.5 mg PO daily PTA ,  last dose was on 8/2 @ 1700.  8/4: INR 3.0 8/5: INR 3.9 8/6: INR 3.0  DDIs: ceftriaxone  Goal of Therapy:  INR 2-3   Plan:  INR has decreased to 3.0, upper end of therapeutic Give warfarin 1 mg (reduced from home dose) Re-check INR in am  Tawnya [place], PharmD, BCPS 07/26/2021,10:14 AM

## 2021-07-26 NOTE — Progress Notes (Signed)
Pt blood pressure remains lower at this time and map is below 60, uf is off pt states she is feeling well just sleepy. RN has been notified of the changes and the patient will continue to monitored closely.

## 2021-07-26 NOTE — Progress Notes (Signed)
Noted slight increase In pt bp, pt states she feels "fine" uf remains off due to low mapping. Will continue to monitor the patient closely.

## 2021-07-26 NOTE — Progress Notes (Signed)
Dialysis contacted @ 14:00 to let them know that albumin was not infused.

## 2021-07-26 NOTE — Progress Notes (Addendum)
[facility]  ROUNDING NOTE   Subjective:   [name] is a 70 y.o.  female with diabetes, Atrial Fib on Coumadin, anemia, pacemaker/AICD, and ESRD on HD, who was admitted to [facility] on 07/23/2021 for Cellulitis of abdominal wall [L03.311] Ventricular tachycardia ([place]) [I47.2] ESRD (end stage renal disease) ([place]) [N18.6] Abdominal wall cellulitis [id number]  Patient seen during dialysis   HEMODIALYSIS FLOWSHEET:  Blood Flow Rate (mL/min): 200 mL/min Arterial Pressure (mmHg): -70 mmHg Venous Pressure (mmHg): 60 mmHg Transmembrane Pressure (mmHg): 70 mmHg Ultrafiltration Rate (mL/min): 70 mL/min Dialysate Flow Rate (mL/min): 500 ml/min Conductivity: Machine : 14 Conductivity: Machine : 14 Dialysis Fluid Bolus: Normal Saline Bolus Amount (mL): 300 mL  Patient resting comfortable No complaints at this time  Objective:  Vital signs in last 24 hours:  Temp:  [97.4 F (36.3 C)-98.6 F (37 C)] 98.4 F (36.9 C) (08/06 1427) Pulse Rate:  [68-108] 69 (08/06 1427) Resp:  [15-24] 18 (08/06 1427) BP: (80-141)/(22-99) 107/57 (08/06 1427) SpO2:  [83 %-100 %] 89 % (08/06 1427)  Weight change:  Filed Weights   07/23/21 1304 07/23/21 2308  Weight: 131.1 kg (!) 142.1 kg    Intake/Output: I/O last 3 completed shifts: In: 681.9 [P.O.:360; I.V.:24.7; IV Piggyback:297.3] Out: 3000 [Other:3000]   Intake/Output this shift:  Total I/O In: -  Out: 1213 [Other:1213]  Physical Exam: General: NAD, laying in bed  Head: Normocephalic, atraumatic. Moist oral mucosal membranes  Eyes: Anicteric  Lungs:  Clear to auscultation, normal effort  Heart: Regular rate and rhythm  Abdomen:  Soft, nontender  Extremities:  2+ peripheral edema.  Neurologic: Nonfocal, moving all four extremities  Skin: No lesions  Access: Rt Permcath    Basic Metabolic Panel: Recent Labs  Lab 07/23/21 1313 07/24/21 0320 07/26/21 0443  NA 134* 135 135  K 3.7 3.9 3.3*  CL 94* 95* 97*  CO2 '25  28 28  '$ GLUCOSE 118* 140* 143*  BUN 28* 31* 14  CREATININE 4.54* 4.99* 3.12*  CALCIUM 8.0* 7.8* 8.2*  MG  --  2.3  --      Liver Function Tests: No results for input(s): AST, ALT, ALKPHOS, BILITOT, PROT, ALBUMIN in the last 168 hours. No results for input(s): LIPASE, AMYLASE in the last 168 hours. No results for input(s): AMMONIA in the last 168 hours.  CBC: Recent Labs  Lab 07/23/21 1313 07/24/21 0320 07/26/21 0443  WBC 10.9* 10.4 9.0  NEUTROABS 7.7  --   --   HGB 9.6* 9.3* 9.6*  HCT 32.6* 31.6* 33.6*  MCV 90.6 91.1 91.6  PLT 228 205 221     Cardiac Enzymes: No results for input(s): CKTOTAL, CKMB, CKMBINDEX, TROPONINI in the last 168 hours.  BNP: Invalid input(s): POCBNP  CBG: Recent Labs  Lab 07/25/21 0807 07/25/21 1617 07/25/21 2012 07/26/21 0843 07/26/21 1422  GLUCAP 127* 126* 196* 162* 127*     Microbiology: Results for orders placed or performed during the hospital encounter of 07/23/21  Resp Panel by RT-PCR (Flu A&B, Covid) Nasopharyngeal Swab     Status: None   Collection Time: 07/23/21  9:22 PM   Specimen: Nasopharyngeal Swab; Nasopharyngeal(NP) swabs in vial transport medium  Result Value Ref Range Status   SARS Coronavirus 2 by RT PCR NEGATIVE NEGATIVE Final    Comment: (NOTE) SARS-CoV-2 target nucleic acids are NOT DETECTED.  The SARS-CoV-2 RNA is generally detectable in upper respiratory specimens during the acute phase of infection. The lowest concentration of SARS-CoV-2 viral copies this assay  can detect is 138 copies/mL. A negative result does not preclude SARS-Cov-2 infection and should not be used as the sole basis for treatment or other patient management decisions. A negative result may occur with  improper specimen collection/handling, submission of specimen other than nasopharyngeal swab, presence of viral mutation(s) within the areas targeted by this assay, and inadequate number of viral copies(<138 copies/mL). A negative result  must be combined with clinical observations, patient history, and epidemiological information. The expected result is Negative.  Fact Sheet for Patients:  EntrepreneurPulse.com.au  Fact Sheet for Healthcare Providers:  IncredibleEmployment.be  This test is no t yet approved or cleared by the [place] FDA and  has been authorized for detection and/or diagnosis of SARS-CoV-2 by FDA under an Emergency Use Authorization (EUA). This EUA will remain  in effect (meaning this test can be used) for the duration of the COVID-19 declaration under Section 564(b)(1) of the Act, 21 U.S.C.section 360bbb-3(b)(1), unless the authorization is terminated  or revoked sooner.       Influenza A by PCR NEGATIVE NEGATIVE Final   Influenza B by PCR NEGATIVE NEGATIVE Final    Comment: (NOTE) The Xpert Xpress SARS-CoV-2/FLU/RSV plus assay is intended as an aid in the diagnosis of influenza from Nasopharyngeal swab specimens and should not be used as a sole basis for treatment. Nasal washings and aspirates are unacceptable for Xpert Xpress SARS-CoV-2/FLU/RSV testing.  Fact Sheet for Patients: EntrepreneurPulse.com.au  Fact Sheet for Healthcare Providers: IncredibleEmployment.be  This test is not yet approved or cleared by the [place] FDA and has been authorized for detection and/or diagnosis of SARS-CoV-2 by FDA under an Emergency Use Authorization (EUA). This EUA will remain in effect (meaning this test can be used) for the duration of the COVID-19 declaration under Section 564(b)(1) of the Act, 21 U.S.C. section 360bbb-3(b)(1), unless the authorization is terminated or revoked.  Performed at [facility], [address] [place], [place] [address]     Coagulation Studies: Recent Labs    07/24/21 0320 07/25/21 0452 07/26/21 0443  LABPROT 31.4* 38.1* 30.9*  INR 3.0* 3.9* 3.0*     Urinalysis: No  results for input(s): COLORURINE, LABSPEC, PHURINE, GLUCOSEU, HGBUR, BILIRUBINUR, KETONESUR, PROTEINUR, UROBILINOGEN, NITRITE, LEUKOCYTESUR in the last 72 hours.  Invalid input(s): APPERANCEUR    Imaging: ECHOCARDIOGRAM COMPLETE  Result Date: 07/25/2021    ECHOCARDIOGRAM REPORT   Patient Name:   Linda Quinn Date of Exam: 07/25/2021 Medical Rec #:  [id number]      Height:       68.0 in Accession #:    [id number]     Weight:       313.3 lb Date of Birth:  26-May-1951     BSA:          2.473 m Patient Age:    89 years       BP:           94/54 mmHg Patient Gender: F              HR:           68 bpm. Exam Location:  [facility] Procedure: 2D Echo, Cardiac Doppler and Color Doppler Indications:     CHF-acute diastolic [id number]  History:         Patient has no prior history of Echocardiogram examinations.                  CHF, Pacemaker, Arrythmias:Atrial Fibrillation; Risk  Factors:Diabetes.  Sonographer:     Sherrie Sport RDCS (AE) Referring Phys:  [place] Diagnosing Phys: Kathlyn Sacramento MD IMPRESSIONS  1. Left ventricular ejection fraction, by estimation, is <20%. The left ventricle has severely decreased function. The left ventricle demonstrates global hypokinesis. The left ventricular internal cavity size was moderately dilated. There is mild left ventricular hypertrophy. Left ventricular diastolic parameters are consistent with Grade II diastolic dysfunction (pseudonormalization).  2. Right ventricular systolic function is moderately reduced. The right ventricular size is moderately enlarged. There is moderately elevated pulmonary artery systolic pressure.  3. Left atrial size was severely dilated.  4. Right atrial size was moderately dilated.  5. The mitral valve is normal in structure. Mild mitral valve regurgitation. No evidence of mitral stenosis. Moderate mitral annular calcification.  6. Tricuspid valve regurgitation is moderate.  7. The aortic valve is normal in structure. Aortic  valve regurgitation is not visualized. Mild to moderate aortic valve sclerosis/calcification is present, without any evidence of aortic stenosis. FINDINGS  Left Ventricle: Left ventricular ejection fraction, by estimation, is <20%. The left ventricle has severely decreased function. The left ventricle demonstrates global hypokinesis. The left ventricular internal cavity size was moderately dilated. There is mild left ventricular hypertrophy. Left ventricular diastolic parameters are consistent with Grade II diastolic dysfunction (pseudonormalization). Right Ventricle: The right ventricular size is moderately enlarged. No increase in right ventricular wall thickness. Right ventricular systolic function is moderately reduced. There is moderately elevated pulmonary artery systolic pressure. The tricuspid  regurgitant velocity is 3.05 m/s, and with an assumed right atrial pressure of 10 mmHg, the estimated right ventricular systolic pressure is 123456 mmHg. Left Atrium: Left atrial size was severely dilated. Right Atrium: Right atrial size was moderately dilated. Pericardium: There is no evidence of pericardial effusion. Mitral Valve: The mitral valve is normal in structure. Moderate mitral annular calcification. Mild mitral valve regurgitation. No evidence of mitral valve stenosis. MV peak gradient, 2.8 mmHg. The mean mitral valve gradient is 1.0 mmHg. Tricuspid Valve: The tricuspid valve is normal in structure. Tricuspid valve regurgitation is moderate . No evidence of tricuspid stenosis. Aortic Valve: The aortic valve is normal in structure. Aortic valve regurgitation is not visualized. Mild to moderate aortic valve sclerosis/calcification is present, without any evidence of aortic stenosis. Aortic valve mean gradient measures 2.0 mmHg. Aortic valve peak gradient measures 3.9 mmHg. Aortic valve area, by VTI measures 1.24 cm. Pulmonic Valve: The pulmonic valve was normal in structure. Pulmonic valve regurgitation is  not visualized. No evidence of pulmonic stenosis. Aorta: The aortic root is normal in size and structure. Venous: The inferior vena cava was not well visualized. IAS/Shunts: No atrial level shunt detected by color flow Doppler. Additional Comments: A device lead is visualized.  LEFT VENTRICLE PLAX 2D LVIDd:         6.51 cm      Diastology LVIDs:         5.97 cm      LV e' medial:    4.24 cm/s LV PW:         1.32 cm      LV E/e' medial:  21.6 LV IVS:        1.27 cm      LV e' lateral:   8.27 cm/s LVOT diam:     2.10 cm      LV E/e' lateral: 11.1 LV SV:         21 LV SV Index:   8 LVOT Area:  3.46 cm  LV Volumes (MOD) LV vol d, MOD A2C: 206.0 ml LV vol d, MOD A4C: 160.0 ml LV vol s, MOD A2C: 142.0 ml LV vol s, MOD A4C: 159.0 ml LV SV MOD A2C:     64.0 ml LV SV MOD A4C:     160.0 ml LV SV MOD BP:      23.6 ml RIGHT VENTRICLE RV Basal diam:  6.18 cm RV S prime:     8.27 cm/s TAPSE (M-mode): 3.9 cm LEFT ATRIUM              Index       RIGHT ATRIUM           Index LA diam:        6.20 cm  2.51 cm/m  RA Area:     23.40 cm LA Vol (A2C):   167.0 ml 67.52 ml/m RA Volume:   74.50 ml  30.12 ml/m LA Vol (A4C):   95.8 ml  38.74 ml/m LA Biplane Vol: 127.0 ml 51.35 ml/m  AORTIC VALVE                   PULMONIC VALVE AV Area (Vmax):    1.19 cm    PV Vmax:        0.62 m/s AV Area (Vmean):   1.23 cm    PV Peak grad:   1.5 mmHg AV Area (VTI):     1.24 cm    RVOT Peak grad: 2 mmHg AV Vmax:           98.97 cm/s AV Vmean:          65.067 cm/s AV VTI:            0.168 m AV Peak Grad:      3.9 mmHg AV Mean Grad:      2.0 mmHg LVOT Vmax:         33.90 cm/s LVOT Vmean:        23.200 cm/s LVOT VTI:          0.060 m LVOT/AV VTI ratio: 0.36  AORTA Ao Root diam: 3.20 cm MITRAL VALVE               TRICUSPID VALVE MV Area (PHT): 4.12 cm    TR Peak grad:   37.2 mmHg MV Area VTI:   1.06 cm    TR Vmax:        305.00 cm/s MV Peak grad:  2.8 mmHg MV Mean grad:  1.0 mmHg    SHUNTS MV Vmax:       0.83 m/s    Systemic VTI:  0.06 m MV Vmean:       54.3 cm/s   Systemic Diam: 2.10 cm MV Decel Time: 184 msec MV E velocity: 91.70 cm/s MV A velocity: 48.80 cm/s MV E/A ratio:  1.88 Kathlyn Sacramento MD Electronically signed by Kathlyn Sacramento MD Signature Date/Time: 07/25/2021/12:53:06 PM    Final      Medications:    sodium chloride Stopped (07/24/21 2351)   albumin human     cefTRIAXone (ROCEPHIN)  IV 1 g (07/25/21 2015)   vancomycin 1,000 mg (07/26/21 1451)    vitamin C  500 mg Oral Daily   aspirin EC  81 mg Oral Daily   calcitRIOL  0.5 mcg Oral Daily   calcium carbonate  2 tablet Oral BID   Chlorhexidine Gluconate Cloth  6 each Topical Daily   diphenhydrAMINE  25 mg Intravenous Once  furosemide  20 mg Intravenous BID   gabapentin  100 mg Oral BID   insulin aspart  0-5 Units Subcutaneous QHS   insulin aspart  0-9 Units Subcutaneous TID WC   loratadine  10 mg Oral Daily   midodrine  10 mg Oral TID with meals   warfarin  1 mg Oral ONCE-1600   Warfarin - Pharmacist Dosing Inpatient   Does not apply q1600   sodium chloride, acetaminophen, zolpidem  Assessment/ Plan:  Ms. Gurtrude Essick is a 70 y.o.  female  with diabetes, Atrial Fib on Coumadin, anemia, pacemaker/AICD, and ESRD on HD, who was admitted to [facility] on 07/23/2021 for Cellulitis of abdominal wall [L03.311] Ventricular tachycardia ([place]) [I47.2] ESRD (end stage renal disease) ([place]) [N18.6] Abdominal wall cellulitis [id number]   [facility] [address]/TTS/Rt Permcath  End stage renal disease on dialysis Will maintain the outpatient schedule, if possible Received dialysis yesterday, UF 3L removed. Receiving dialysis today with UF goal 2.5L. Patient hypotensive and UF adjusted multiple times. UF remains off last 30 minutes of treatment. Albumin ordered but not given with limited time remaining in treatment. UF 1.2 achieved. Total UF removed over past three days of treatment is 6.7L. Next treatment scheduled for Tuesday.   2. Anemia of chronic kidney disease Lab Results   Component Value Date   HGB 9.6 (L) 07/26/2021   Hgb within acceptable range Will monitor labs  3. Secondary Hyperparathyroidism: Lab Results  Component Value Date   CALCIUM 8.2 (L) 07/26/2021   Calcium not at target Calcium carbonate BID    LOS: 2 Kenia Teagarden 8/6/20223:00 PM

## 2021-07-26 NOTE — Progress Notes (Signed)
Pt showing signs of improvement in blood pressure with decreased goal. She states her pains are easing and I will continue to monitor the patient closely for changes in symptoms.

## 2021-07-26 NOTE — Progress Notes (Signed)
Noted lower diastolic pressure, blood pressure cuff has been changed and adjusted will retake the patients  blood pressure RN is at bedside assessing pt. Pt states she feels fine just discomfort in her abdomen.

## 2021-07-26 NOTE — Progress Notes (Signed)
Noted decrease in pt bp, pt continues to state she is feeling "fine" she has been helped reposition in bed and is being monitored closely for changes. Uf remains on at this time.

## 2021-07-27 LAB — BASIC METABOLIC PANEL
Anion gap: 12 (ref 5–15)
BUN: 13 mg/dL (ref 8–23)
CO2: 28 mmol/L (ref 22–32)
Calcium: 8.3 mg/dL — ABNORMAL LOW (ref 8.9–10.3)
Chloride: 95 mmol/L — ABNORMAL LOW (ref 98–111)
Creatinine, Ser: 3.06 mg/dL — ABNORMAL HIGH (ref 0.44–1.00)
GFR, Estimated: 16 mL/min — ABNORMAL LOW (ref >60)
Glucose, Bld: 132 mg/dL — ABNORMAL HIGH (ref 70–99)
Potassium: 3.4 mmol/L — ABNORMAL LOW (ref 3.5–5.1)
Sodium: 135 mmol/L (ref 135–145)

## 2021-07-27 LAB — CBC
HCT: 34.1 % — ABNORMAL LOW (ref 36.0–46.0)
Hemoglobin: 9.8 g/dL — ABNORMAL LOW (ref 12.0–15.0)
MCH: 26.4 pg (ref 26.0–34.0)
MCHC: 28.7 g/dL — ABNORMAL LOW (ref 30.0–36.0)
MCV: 91.9 fL (ref 80.0–100.0)
Platelets: 242 10*3/uL (ref 150–400)
RBC: 3.71 MIL/uL — ABNORMAL LOW (ref 3.87–5.11)
RDW: 23.5 % — ABNORMAL HIGH (ref 11.5–15.5)
WBC: 9.5 10*3/uL (ref 4.0–10.5)
nRBC: 0.3 % — ABNORMAL HIGH (ref 0.0–0.2)

## 2021-07-27 LAB — PROTIME-INR
INR: 2.6 — ABNORMAL HIGH (ref 0.8–1.2)
Prothrombin Time: 27.5 seconds — ABNORMAL HIGH (ref 11.4–15.2)

## 2021-07-27 LAB — GLUCOSE, CAPILLARY
Glucose-Capillary: 125 mg/dL — ABNORMAL HIGH (ref 70–99)
Glucose-Capillary: 133 mg/dL — ABNORMAL HIGH (ref 70–99)
Glucose-Capillary: 152 mg/dL — ABNORMAL HIGH (ref 70–99)
Glucose-Capillary: 156 mg/dL — ABNORMAL HIGH (ref 70–99)

## 2021-07-27 LAB — MRSA NEXT GEN BY PCR, NASAL: MRSA by PCR Next Gen: NOT DETECTED

## 2021-07-27 MED ORDER — WARFARIN SODIUM 2.5 MG PO TABS
2.5000 mg | ORAL_TABLET | Freq: Every day | ORAL | Status: DC
  Administered 2021-07-27 – 2021-07-28 (×2): 2.5 mg via ORAL
  Filled 2021-07-27 (×3): qty 1

## 2021-07-27 NOTE — Evaluation (Signed)
Physical Therapy Evaluation Patient Details Name: Linda Quinn MRN: [id number] DOB: October 30, 1951 Today's Date: 07/27/2021   History of Present Illness  Pt is a 70 y/o F admitted on 07/23/21 with c/c of pacemaker beeping. Pt also complained of abdominal pain and distention and an oozing wound. Pt is being treated for cellulitis of abdominal wall & AICD firing for v-tach on 7/27. PMH: paroxysmal a-fib on coumadin, ESRD on HD MWF, DM, pacemaker/AICD, anemia of CKD, class 3 obesity, CHF, sleep apnea   Clinical Impression  Pt seen for PT evaluation with pt motivated to participate & wishing her body could move like she wants. Pt attempts supine>sit but unable to achieve full upright sitting at EOB with only 1 person assist. Pt performs BLE strengthening exercises with cuing for technique; pt is limited by body habitus. Pt is able to assist with repositioning in bed with trendelenburg position to aide & mod assist +2. Positioned pt in chair position in bed for more upright tolerance and change in position but pt continues to prefer rotating to L side. Pt would benefit from STR upon d/c to maximize independence with functional mobility & decrease caregiver burden prior to return home.     Follow Up Recommendations SNF    Equipment Recommendations  None recommended by PT (TBD in next venue)    Recommendations for Other Services       Precautions / Restrictions Precautions Precautions: Fall Restrictions Weight Bearing Restrictions: No      Mobility  Bed Mobility               General bed mobility comments: Attempted supine>sit with HOB elevated & bed rails but pt unable to achieve full upright sitting EOB even with total assist +1. Pt does initiate moving BLE to EOB but ultimately requires assistance, stating RLE is more weak compared to LLE.    Transfers                    Ambulation/Gait                Stairs            Wheelchair Mobility    Modified  Rankin (Stroke Patients Only)       Balance                                             Pertinent Vitals/Pain Pain Assessment: Faces Faces Pain Scale: Hurts little more Pain Location: abdomen Pain Descriptors / Indicators: Grimacing;Discomfort Pain Intervention(s): Repositioned;Monitored during session    Home Living Family/patient expects to be discharged to:: Private residence Living Arrangements: Spouse/significant other Available Help at Discharge: Family Type of Home: House Home Access: Ramped entrance              Prior Function Level of Independence: Needs assistance   Gait / Transfers Assistance Needed: Pt reports prior to STR performed w/c pivot t/f, has not walked since rehab at [facility] (states she is not seeeing therapy and not getting OOB)           Hand Dominance        Extremity/Trunk Assessment   Upper Extremity Assessment Upper Extremity Assessment: Generalized weakness    Lower Extremity Assessment Lower Extremity Assessment: Generalized weakness (Pt with BLE edema and body habitus that limits ROM)       Communication   Communication:  No difficulties  Cognition Arousal/Alertness: Awake/alert Behavior During Therapy: Flat affect Overall Cognitive Status: No family/caregiver present to determine baseline cognitive functioning                                 General Comments: Initially she reports she's in [facility] then states she's in the hospital. Very eager to move & participate.      General Comments      Exercises General Exercises - Lower Extremity Ankle Circles/Pumps: AROM;Both;20 reps;Supine Heel Slides: AAROM;Strengthening;Both;10 reps;Supine (unable to maintain neutral alignment, BLE splay out, PT attempts to facilitate neutral alignment but pt limited by body habitus) Hip ABduction/ADduction: AROM;Strengthening;Both;10 reps;Supine (hip abduction pillow squeezes)    Assessment/Plan    PT Assessment Patient needs continued PT services  PT Problem List Decreased strength;Decreased mobility;Decreased balance;Decreased activity tolerance;Decreased range of motion;Obesity;Decreased knowledge of use of DME;Pain;Decreased skin integrity       PT Treatment Interventions DME instruction;Therapeutic activities;Modalities;Gait training;Therapeutic exercise;Patient/family education;Balance training;Functional mobility training;Neuromuscular re-education;Manual techniques;Wheelchair mobility training    PT Goals (Current goals can be found in the Care Plan section)  Acute Rehab PT Goals Patient Stated Goal: to stand PT Goal Formulation: With patient Time For Goal Achievement: 08/10/21 Potential to Achieve Goals: Fair    Frequency Min 2X/week   Barriers to discharge Decreased caregiver support      Co-evaluation               AM-PAC PT "6 Clicks" Mobility  Outcome Measure Help needed turning from your back to your side while in a flat bed without using bedrails?: Total Help needed moving from lying on your back to sitting on the side of a flat bed without using bedrails?: Total Help needed moving to and from a bed to a chair (including a wheelchair)?: Total Help needed standing up from a chair using your arms (e.g., wheelchair or bedside chair)?: Total Help needed to walk in hospital room?: Total Help needed climbing 3-5 steps with a railing? : Total 6 Click Score: 6    End of Session   Activity Tolerance: Patient tolerated treatment well Patient left: in bed;with call bell/phone within reach;with bed alarm set (in chair position) Nurse Communication: Mobility status PT Visit Diagnosis: Muscle weakness (generalized) (M62.81);Difficulty in walking, not elsewhere classified (R26.2)    Time: [id number] PT Time Calculation (min) (ACUTE ONLY): 12 min   Charges:   PT Evaluation $PT Eval Moderate Complexity: [place], PT, DPT 07/27/21, 12:20 PM   [name] 07/27/2021, 12:16 PM

## 2021-07-27 NOTE — Progress Notes (Signed)
[facility]  ROUNDING NOTE   Subjective:   [name] is a 70 y.o.  female with diabetes, Atrial Fib on Coumadin, anemia, pacemaker/AICD, and ESRD on HD, who was admitted to [facility] on 07/23/2021 for Cellulitis of abdominal wall [L03.311] Ventricular tachycardia ([place]) [I47.2] ESRD (end stage renal disease) ([place]) [N18.6] Abdominal wall cellulitis [id number]  Patient resting comfortable Tolerating meals Complains of discomfort from right lower abdomin, pruritis No other complaints at this time  Objective:  Vital signs in last 24 hours:  Temp:  [97.4 F (36.3 C)-98.4 F (36.9 C)] 97.4 F (36.3 C) (08/07 0759) Pulse Rate:  [69-70] 70 (08/07 0759) Resp:  [16-20] 20 (08/07 0759) BP: (97-120)/(56-65) 99/65 (08/07 0759) SpO2:  [89 %-94 %] 94 % (08/07 0759)  Weight change:  Filed Weights   07/23/21 1304 07/23/21 2308  Weight: 131.1 kg (!) 142.1 kg    Intake/Output: I/O last 3 completed shifts: In: -  Out: 1213 [Other:1213]   Intake/Output this shift:  No intake/output data recorded.  Physical Exam: General: NAD, laying in bed  Head: Normocephalic, atraumatic. Moist oral mucosal membranes  Eyes: Anicteric  Lungs:  Clear to auscultation, normal effort  Heart: Regular rate and rhythm  Abdomen:  Soft, nontender  Extremities:  2+ peripheral edema.(Improved)  Neurologic: Nonfocal, moving all four extremities  Skin: No lesions  Access: Rt Permcath    Basic Metabolic Panel: Recent Labs  Lab 07/23/21 1313 07/24/21 0320 07/26/21 0443 07/27/21 0904  NA 134* 135 135 135  K 3.7 3.9 3.3* 3.4*  CL 94* 95* 97* 95*  CO2 '25 28 28 28  '$ GLUCOSE 118* 140* 143* 132*  BUN 28* 31* 14 13  CREATININE 4.54* 4.99* 3.12* 3.06*  CALCIUM 8.0* 7.8* 8.2* 8.3*  MG  --  2.3  --   --      Liver Function Tests: No results for input(s): AST, ALT, ALKPHOS, BILITOT, PROT, ALBUMIN in the last 168 hours. No results for input(s): LIPASE, AMYLASE in the last 168 hours. No results  for input(s): AMMONIA in the last 168 hours.  CBC: Recent Labs  Lab 07/23/21 1313 07/24/21 0320 07/26/21 0443 07/27/21 0904  WBC 10.9* 10.4 9.0 9.5  NEUTROABS 7.7  --   --   --   HGB 9.6* 9.3* 9.6* 9.8*  HCT 32.6* 31.6* 33.6* 34.1*  MCV 90.6 91.1 91.6 91.9  PLT 228 205 221 242     Cardiac Enzymes: No results for input(s): CKTOTAL, CKMB, CKMBINDEX, TROPONINI in the last 168 hours.  BNP: Invalid input(s): POCBNP  CBG: Recent Labs  Lab 07/26/21 1422 07/26/21 1652 07/26/21 2103 07/27/21 0759 07/27/21 1143  GLUCAP 127* 131* 124* 125* 133*     Microbiology: Results for orders placed or performed during the hospital encounter of 07/23/21  Resp Panel by RT-PCR (Flu A&B, Covid) Nasopharyngeal Swab     Status: None   Collection Time: 07/23/21  9:22 PM   Specimen: Nasopharyngeal Swab; Nasopharyngeal(NP) swabs in vial transport medium  Result Value Ref Range Status   SARS Coronavirus 2 by RT PCR NEGATIVE NEGATIVE Final    Comment: (NOTE) SARS-CoV-2 target nucleic acids are NOT DETECTED.  The SARS-CoV-2 RNA is generally detectable in upper respiratory specimens during the acute phase of infection. The lowest concentration of SARS-CoV-2 viral copies this assay can detect is 138 copies/mL. A negative result does not preclude SARS-Cov-2 infection and should not be used as the sole basis for treatment or other patient management decisions. A negative result may  occur with  improper specimen collection/handling, submission of specimen other than nasopharyngeal swab, presence of viral mutation(s) within the areas targeted by this assay, and inadequate number of viral copies(<138 copies/mL). A negative result must be combined with clinical observations, patient history, and epidemiological information. The expected result is Negative.  Fact Sheet for Patients:  EntrepreneurPulse.com.au  Fact Sheet for Healthcare Providers:   IncredibleEmployment.be  This test is no t yet approved or cleared by the [place] FDA and  has been authorized for detection and/or diagnosis of SARS-CoV-2 by FDA under an Emergency Use Authorization (EUA). This EUA will remain  in effect (meaning this test can be used) for the duration of the COVID-19 declaration under Section 564(b)(1) of the Act, 21 U.S.C.section 360bbb-3(b)(1), unless the authorization is terminated  or revoked sooner.       Influenza A by PCR NEGATIVE NEGATIVE Final   Influenza B by PCR NEGATIVE NEGATIVE Final    Comment: (NOTE) The Xpert Xpress SARS-CoV-2/FLU/RSV plus assay is intended as an aid in the diagnosis of influenza from Nasopharyngeal swab specimens and should not be used as a sole basis for treatment. Nasal washings and aspirates are unacceptable for Xpert Xpress SARS-CoV-2/FLU/RSV testing.  Fact Sheet for Patients: EntrepreneurPulse.com.au  Fact Sheet for Healthcare Providers: IncredibleEmployment.be  This test is not yet approved or cleared by the [place] FDA and has been authorized for detection and/or diagnosis of SARS-CoV-2 by FDA under an Emergency Use Authorization (EUA). This EUA will remain in effect (meaning this test can be used) for the duration of the COVID-19 declaration under Section 564(b)(1) of the Act, 21 U.S.C. section 360bbb-3(b)(1), unless the authorization is terminated or revoked.  Performed at [facility], [address] [place], [place] [address]     Coagulation Studies: Recent Labs    07/25/21 0452 07/26/21 0443 07/27/21 0437  LABPROT 38.1* 30.9* 27.5*  INR 3.9* 3.0* 2.6*     Urinalysis: No results for input(s): COLORURINE, LABSPEC, PHURINE, GLUCOSEU, HGBUR, BILIRUBINUR, KETONESUR, PROTEINUR, UROBILINOGEN, NITRITE, LEUKOCYTESUR in the last 72 hours.  Invalid input(s): APPERANCEUR    Imaging: No results  found.   Medications:    sodium chloride Stopped (07/24/21 2351)   albumin human     cefTRIAXone (ROCEPHIN)  IV Stopped (07/26/21 2231)   vancomycin Stopped (07/26/21 1551)    vitamin C  500 mg Oral Daily   aspirin EC  81 mg Oral Daily   calcitRIOL  0.5 mcg Oral Daily   calcium carbonate  2 tablet Oral BID   Chlorhexidine Gluconate Cloth  6 each Topical Daily   diphenhydrAMINE  25 mg Intravenous Once   furosemide  20 mg Intravenous BID   gabapentin  100 mg Oral BID   insulin aspart  0-5 Units Subcutaneous QHS   insulin aspart  0-9 Units Subcutaneous TID WC   loratadine  10 mg Oral Daily   midodrine  10 mg Oral TID with meals   warfarin  2.5 mg Oral q1600   Warfarin - Pharmacist Dosing Inpatient   Does not apply q1600   sodium chloride, acetaminophen, zolpidem  Assessment/ Plan:  [name] is a 70 y.o.  female  with diabetes, Atrial Fib on Coumadin, anemia, pacemaker/AICD, and ESRD on HD, who was admitted to [facility] on 07/23/2021 for Cellulitis of abdominal wall [L03.311] Ventricular tachycardia ([place]) [I47.2] ESRD (end stage renal disease) ([place]) [N18.6] Abdominal wall cellulitis [id number]   [facility] [address]/TTS/Rt Permcath  End stage renal disease on dialysis Will  maintain the outpatient schedule, if possible Received extra treatment last week. Total UF removed over those three days of treatment is 6.7L. No acute need for dialysis today.Next treatment scheduled for Tuesday. Will continue to monitor and adjust pan as needed.  2. Anemia of chronic kidney disease Lab Results  Component Value Date   HGB 9.8 (L) 07/27/2021   Hgb within acceptable range Will monitor labs  3. Secondary Hyperparathyroidism: Lab Results  Component Value Date   CALCIUM 8.3 (L) 07/27/2021   Calcium not at target Calcium carbonate BID    LOS: 3 Adlean Hardeman 8/7/202212:46 PM

## 2021-07-27 NOTE — Progress Notes (Signed)
[place] for [facility] Indication: atrial fibrillation  Patient Measurements: Height: '5\' 8"'$  (172.7 cm) Weight: (!) 142.1 kg (313 lb 4.4 oz) IBW/kg (Calculated) : 63.9  Vital Signs: Temp: 97.4 F (36.3 C) (08/07 0759) Temp Source: Oral (08/07 0759) BP: 99/65 (08/07 0759) Pulse Rate: 70 (08/07 0759)  Labs: Recent Labs    07/25/21 0452 07/26/21 0443 07/27/21 0437 07/27/21 0904  HGB  --  9.6*  --  9.8*  HCT  --  33.6*  --  34.1*  PLT  --  221  --  242  LABPROT 38.1* 30.9* 27.5*  --   INR 3.9* 3.0* 2.6*  --   CREATININE  --  3.12*  --  3.06*     Estimated Creatinine Clearance: 26.8 mL/min (A) (by C-G formula based on SCr of 3.06 mg/dL (H)).   Medical History: Past Medical History:  Diagnosis Date   CHF (congestive heart failure) ([place])    Diabetes mellitus without complication (HCC)    Paroxysmal atrial fibrillation (HCC)    Renal disorder    Respiratory failure ([place])    Sleep apnea      Assessment: 70 y.o. female with PMH of diabetes, Atrial Fib on Coumadin, anemia, pacemaker/AICD, and ESRD on HD admitted on 07/23/2021 with abdominal wall cellulitis. Pharmacy was consulted to dose warfarin in this 70 year old female admitted with AFib. She was on warfarin 2.5 mg PO daily PTA ,  last dose was on 8/2 at 1700.  8/4 INR 3.0 8/5 INR 3.9 8/6 INR 3.0 8/7 INR 2.6  DDIs: ceftriaxone  Goal of Therapy:  INR 2-3   Plan:  INR therapeutic Resume home dose of warfarin Re-check INR in am  Tawnya Crook, PharmD, BCPS 07/27/2021,10:00 AM

## 2021-07-27 NOTE — Progress Notes (Signed)
PROGRESS NOTE    Linda Quinn  [id number] DOB: Nov 22, 1951 DOA: 07/23/2021 PCP: Pcp, No    No chief complaint on file.   Brief Narrative:  Linda Quinn is a 70 y.o.  female with diabetes, Atrial Fib on Coumadin, anemia, pacemaker/AICD, and ESRD on HD, who was admitted to [facility] on 07/23/2021 for Cellulitis of abdominal wall.   Assessment & Plan:   Principal Problem:   Cellulitis of abdominal wall Active Problems:   Anemia in chronic kidney disease   Presence of cardiac pacemaker   Type 2 diabetes mellitus with diabetic neuropathy, without long-term current use of insulin (HCC)   Unspecified systolic (congestive) heart failure (HCC)   Obesity, Class III, BMI 40-49.9 (morbid obesity) ([place])   Pacemaker malfunction, initial encounter   AF (paroxysmal atrial fibrillation) (HCC)   Pressure injury of skin   Abdominal wall cellulitis   Ventricular tachycardia (HCC)   Pulmonary vascular congestion   ESRD (end stage renal disease) (HCC)   Acute on chronic systolic CHF (congestive heart failure) (HCC)   Hypotension   Acute on chronic systolic heart failure with anasarca: Last echocardiogram showed LVEF of less than 20%.  Management with HD.   Transient episode of Ventricular Tachycardia:  AICD firing Cardiology consulted, no further recommendations.  Continue with amiodarone for ventricular arrhythmias.   Abdominal wall cellulitis with drainage:  She was started on broad spectrum IV antibiotics. Pain control.  Worsened by anasarca.    ESRD on HD:  Nephrology on board.    Paroxysmal atrial fibrillation:  Rate controlled with amiodarone.  On coumadin for anti coagulation.  Therapeutic INR.     Morbid obesity:  Body mass index is 47.63 kg/m.     Type 2 DM with neuropathy on gabapentin;  CBG (last 3)  Recent Labs    07/26/21 2103 07/27/21 0759 07/27/21 1143  GLUCAP 124* 125* 133*   Hemoglobin A1c is 7.3%.    Pressure injury: Present on admission.   Pressure Injury 07/23/21 Ankle Left;Posterior Stage 2 -  Partial thickness loss of dermis presenting as a shallow open injury with a red, pink wound bed without slough. epithelialized (Active)  07/23/21 2315  Location: Ankle  Location Orientation: Left;Posterior  Staging: Stage 2 -  Partial thickness loss of dermis presenting as a shallow open injury with a red, pink wound bed without slough.  Wound Description (Comments): epithelialized  Present on Admission: Yes     Pressure Injury Leg Right;Posterior Stage 2 -  Partial thickness loss of dermis presenting as a shallow open injury with a red, pink wound bed without slough. epithelialized (Active)     Location: Leg  Location Orientation: Right;Posterior  Staging: Stage 2 -  Partial thickness loss of dermis presenting as a shallow open injury with a red, pink wound bed without slough.  Wound Description (Comments): epithelialized  Present on Admission: Yes        DVT prophylaxis: (Heparin) Code Status: (Full Code) Family Communication: none at bedside.  Disposition:   Status is: Inpatient  Remains inpatient appropriate because:Unsafe d/c plan and IV treatments appropriate due to intensity of illness or inability to take PO  Dispo: The patient is from: Home              Anticipated d/c is to: SNF              Patient currently is not medically stable to d/c.   Difficult to place patient No       Consultants:  nephrology  Procedures: none   Antimicrobials: None   Subjective: Abd pain intermittent.   Objective: Vitals:   07/26/21 1615 07/26/21 2100 07/27/21 0640 07/27/21 0759  BP: (!) 120/56 111/65 (!) 97/57 99/65  Pulse: 70 70 70 70  Resp: '18 18 16 20  '$ Temp: 97.9 F (36.6 C) 98.2 F (36.8 C) 98 F (36.7 C) (!) 97.4 F (36.3 C)  TempSrc: Oral  Oral Oral  SpO2: 92% 92% 93% 94%  Weight:      Height:       No intake or output data in the 24 hours ending 07/27/21 1428 Filed Weights   07/23/21 1304 07/23/21  2308  Weight: 131.1 kg (!) 142.1 kg    Examination:  General exam: Chronically ill appearing , obese lady , not in distress  Respiratory system: Clear to auscultation. Respiratory effort normal. Cardiovascular system: S1 & S2 heard, RRR. No JVD,  pedal edema present . Gastrointestinal system: Abdomen is firm,  Central nervous system: Alert and oriented. No focal neurological deficits. Extremities: Trace pedal edema.  Skin: induration over the abdomen.  Psychiatry:  Mood & affect appropriate.     Data Reviewed: I have personally reviewed following labs and imaging studies  CBC: Recent Labs  Lab 07/23/21 1313 07/24/21 0320 07/26/21 0443 07/27/21 0904  WBC 10.9* 10.4 9.0 9.5  NEUTROABS 7.7  --   --   --   HGB 9.6* 9.3* 9.6* 9.8*  HCT 32.6* 31.6* 33.6* 34.1*  MCV 90.6 91.1 91.6 91.9  PLT 228 205 221 [id number]    Basic Metabolic Panel: Recent Labs  Lab 07/23/21 1313 07/24/21 0320 07/26/21 0443 07/27/21 0904  NA 134* 135 135 135  K 3.7 3.9 3.3* 3.4*  CL 94* 95* 97* 95*  CO2 '25 28 28 28  '$ GLUCOSE 118* 140* 143* 132*  BUN 28* 31* 14 13  CREATININE 4.54* 4.99* 3.12* 3.06*  CALCIUM 8.0* 7.8* 8.2* 8.3*  MG  --  2.3  --   --     GFR: Estimated Creatinine Clearance: 26.8 mL/min (A) (by C-G formula based on SCr of 3.06 mg/dL (H)).  Liver Function Tests: No results for input(s): AST, ALT, ALKPHOS, BILITOT, PROT, ALBUMIN in the last 168 hours.  CBG: Recent Labs  Lab 07/26/21 1422 07/26/21 1652 07/26/21 2103 07/27/21 0759 07/27/21 1143  GLUCAP 127* 131* 124* 125* 133*     Recent Results (from the past 240 hour(s))  Resp Panel by RT-PCR (Flu A&B, Covid) Nasopharyngeal Swab     Status: None   Collection Time: 07/23/21  9:22 PM   Specimen: Nasopharyngeal Swab; Nasopharyngeal(NP) swabs in vial transport medium  Result Value Ref Range Status   SARS Coronavirus 2 by RT PCR NEGATIVE NEGATIVE Final    Comment: (NOTE) SARS-CoV-2 target nucleic acids are NOT  DETECTED.  The SARS-CoV-2 RNA is generally detectable in upper respiratory specimens during the acute phase of infection. The lowest concentration of SARS-CoV-2 viral copies this assay can detect is 138 copies/mL. A negative result does not preclude SARS-Cov-2 infection and should not be used as the sole basis for treatment or other patient management decisions. A negative result may occur with  improper specimen collection/handling, submission of specimen other than nasopharyngeal swab, presence of viral mutation(s) within the areas targeted by this assay, and inadequate number of viral copies(<138 copies/mL). A negative result must be combined with clinical observations, patient history, and epidemiological information. The expected result is Negative.  Fact Sheet for Patients:  EntrepreneurPulse.com.au  Fact Sheet for Healthcare  Providers:  IncredibleEmployment.be  This test is no t yet approved or cleared by the [place] and  has been authorized for detection and/or diagnosis of SARS-CoV-2 by FDA under an Emergency Use Authorization (EUA). This EUA will remain  in effect (meaning this test can be used) for the duration of the COVID-19 declaration under Section 564(b)(1) of the Act, 21 U.S.C.section 360bbb-3(b)(1), unless the authorization is terminated  or revoked sooner.       Influenza A by PCR NEGATIVE NEGATIVE Final   Influenza B by PCR NEGATIVE NEGATIVE Final    Comment: (NOTE) The Xpert Xpress SARS-CoV-2/FLU/RSV plus assay is intended as an aid in the diagnosis of influenza from Nasopharyngeal swab specimens and should not be used as a sole basis for treatment. Nasal washings and aspirates are unacceptable for Xpert Xpress SARS-CoV-2/FLU/RSV testing.  Fact Sheet for Patients: EntrepreneurPulse.com.au  Fact Sheet for Healthcare Providers: IncredibleEmployment.be  This test is not yet  approved or cleared by the [place] FDA and has been authorized for detection and/or diagnosis of SARS-CoV-2 by FDA under an Emergency Use Authorization (EUA). This EUA will remain in effect (meaning this test can be used) for the duration of the COVID-19 declaration under Section 564(b)(1) of the Act, 21 U.S.C. section 360bbb-3(b)(1), unless the authorization is terminated or revoked.  Performed at [facility], [address] [place], [place] [address]          Radiology Studies: No results found.      Scheduled Meds:  vitamin C  500 mg Oral Daily   aspirin EC  81 mg Oral Daily   calcitRIOL  0.5 mcg Oral Daily   calcium carbonate  2 tablet Oral BID   Chlorhexidine Gluconate Cloth  6 each Topical Daily   diphenhydrAMINE  25 mg Intravenous Once   furosemide  20 mg Intravenous BID   gabapentin  100 mg Oral BID   insulin aspart  0-5 Units Subcutaneous QHS   insulin aspart  0-9 Units Subcutaneous TID WC   loratadine  10 mg Oral Daily   midodrine  10 mg Oral TID with meals   warfarin  2.5 mg Oral q1600   Warfarin - Pharmacist Dosing Inpatient   Does not apply q1600   Continuous Infusions:  sodium chloride Stopped (07/24/21 2351)   albumin human     cefTRIAXone (ROCEPHIN)  IV Stopped (07/26/21 2231)   vancomycin Stopped (07/26/21 1551)     LOS: 3 days        Hosie Poisson, MD [facility]   To contact the attending provider between 7A-7P or the covering provider during after hours 7P-7A, please log into the web site [email or web address] and access using universal [place] password for that web site. If you do not have the password, please call the hospital operator.  07/27/2021, 2:28 PM

## 2021-07-28 ENCOUNTER — Inpatient Hospital Stay: Payer: Medicare HMO

## 2021-07-28 LAB — GLUCOSE, CAPILLARY
Glucose-Capillary: 149 mg/dL — ABNORMAL HIGH (ref 70–99)
Glucose-Capillary: 167 mg/dL — ABNORMAL HIGH (ref 70–99)
Glucose-Capillary: 155 mg/dL — ABNORMAL HIGH (ref 70–99)
Glucose-Capillary: 159 mg/dL — ABNORMAL HIGH (ref 70–99)

## 2021-07-28 LAB — RESP PANEL BY RT-PCR (FLU A&B, COVID) ARPGX2
Influenza A by PCR: NEGATIVE
Influenza B by PCR: NEGATIVE
SARS Coronavirus 2 by RT PCR: NEGATIVE

## 2021-07-28 LAB — PROTIME-INR
INR: 2.4 — ABNORMAL HIGH (ref 0.8–1.2)
Prothrombin Time: 26.1 seconds — ABNORMAL HIGH (ref 11.4–15.2)

## 2021-07-28 MED ORDER — DOXYCYCLINE HYCLATE 100 MG PO TABS: 100.0000 mg | ORAL_TABLET | Freq: Every day | ORAL | Status: DC

## 2021-07-28 MED ORDER — CALCIUM CARBONATE ANTACID 500 MG PO CHEW: 2.0000 | CHEWABLE_TABLET | Freq: Two times a day (BID) | ORAL | 2 refills | Status: AC

## 2021-07-28 MED ORDER — DOXYCYCLINE HYCLATE 100 MG PO TABS
100.0000 mg | ORAL_TABLET | Freq: Two times a day (BID) | ORAL | Status: DC
  Administered 2021-07-28 – 2021-07-29 (×2): 100 mg via ORAL
  Filled 2021-07-28 (×2): qty 1

## 2021-07-28 MED ORDER — DOXYCYCLINE HYCLATE 100 MG PO TABS: 100.0000 mg | ORAL_TABLET | Freq: Two times a day (BID) | ORAL | 0 refills | Status: DC

## 2021-07-28 NOTE — Progress Notes (Signed)
[place] for [facility] Indication: atrial fibrillation  Patient Measurements: Height: '5\' 8"'$  (172.7 cm) Weight: (!) 142.1 kg (313 lb 4.4 oz) IBW/kg (Calculated) : 63.9  Vital Signs: Temp: 97.5 F (36.4 C) (08/08 0804) Temp Source: Oral (08/08 0804) BP: 109/64 (08/08 0804) Pulse Rate: 70 (08/08 0804)  Labs: Recent Labs    07/26/21 0443 07/27/21 0437 07/27/21 0904 07/28/21 0607  HGB 9.6*  --  9.8*  --   HCT 33.6*  --  34.1*  --   PLT 221  --  242  --   LABPROT 30.9* 27.5*  --  26.1*  INR 3.0* 2.6*  --  2.4*  CREATININE 3.12*  --  3.06*  --      Estimated Creatinine Clearance: 26.8 mL/min (A) (by C-G formula based on SCr of 3.06 mg/dL (H)).   Medical History: Past Medical History:  Diagnosis Date   CHF (congestive heart failure) ([place])    Diabetes mellitus without complication (HCC)    Paroxysmal atrial fibrillation (HCC)    Renal disorder    Respiratory failure ([place])    Sleep apnea      Assessment: 70 y.o. female with PMH of diabetes, Atrial Fib on Coumadin, anemia, pacemaker/AICD, and ESRD on HD admitted on 07/23/2021 with abdominal wall cellulitis. Pharmacy was consulted to dose warfarin in this 70 year old female admitted with AFib. She was on warfarin 2.5 mg PO daily PTA ,  last dose was on 8/2 at 1700.  8/4 INR 3.0 8/5 INR 3.9 8/6 INR 3.0 1 mg 8/7 INR 2.6 2.5 mg 8/8 INR 2.4  DDIs: ceftriaxone, Vancomycin  Goal of Therapy:  INR 2-3   Plan:  INR therapeutic Continue home dose of warfarin Re-check INR in am  Birtie Fellman A, PharmD 07/28/2021,9:02 AM

## 2021-07-28 NOTE — TOC Transition Note (Addendum)
Transition of Care [facility]) - CM/SW Discharge Note   Patient Details  Name: Linda Quinn MRN: [id number] Date of Birth: 1951/06/12  [facility] CM/SW Contact:  Beverly Sessions, RN Phone Number: 07/28/2021, 3:08 PM   Clinical Narrative:     Plan for discharge today Patient declines for me to update any family or friends  Per Lavella Lemons at [facility] they are not in network with [place] and will be returning under her medicaid   EMS packet on chart Repeat covid negative    300pm -  Tanya at [facility] still not able to give me a room assignment at this time   Final next level of care: [place]     Patient Goals and CMS Choice        Discharge Placement              Patient chooses bed at: [facility]        Discharge Plan and Services                                     Social Determinants of Health (SDOH) Interventions     Readmission Risk Interventions No flowsheet data found.

## 2021-07-28 NOTE — Discharge Instructions (Signed)
Cleanse lower abdomen wound with saline, pat dry. Cut to fit piece of silver hydrofiber (Aquacel Ag+) Kellie Simmering # [phone] and place over the wound bed. Top with ABD pad and secure with tape. Change daily.

## 2021-07-28 NOTE — Care Management Important Message (Signed)
Important Message  Patient Details  Name: Linda Quinn MRN: [id number] Date of Birth: October 30, 1951   Medicare Important Message Given:  Yes  Patient is in isolation room so I talked with her by phone [phone] and reviewed her Important Message from Medicare.  She said she was in agreement with the discharge plan.  I asked if she would like a copy of this form and she replied yes.  I will send via certified mail to the address she provided: [address] [place], [place] [address]. I wished her a speedy recovery and thanked her for her time.    Linda Quinn 07/28/2021, 2:41 PM

## 2021-07-28 NOTE — TOC Progression Note (Signed)
Transition of Care [facility]) - Progression Note    Patient Details  Name: Linda Quinn MRN: [id number] Date of Birth: 01/29/1951  [facility]) CM/SW Contact  Beverly Sessions, RN Phone Number: 07/28/2021, 4:16 PM  Clinical Narrative:        Received follow up from [place] at [facility].  They will not be able to accept patient today due to patient and not being vaccinated and having to make room changes at the facility     Expected Discharge Plan and Services                                                 Social Determinants of Health (SDOH) Interventions    Readmission Risk Interventions No flowsheet data found.

## 2021-07-28 NOTE — Progress Notes (Signed)
Pharmacy Antibiotic Note  Linda Quinn is a 70 y.o. female with PMH of diabetes, Atrial Fib on Coumadin, anemia, pacemaker/AICD, and ESRD on HD admitted on 07/23/2021 with abdominal wall cellulitis.  Pharmacy has been consulted for vancomycin dosing. She received a total of 2500 mg IV vancomycin since admission and underwent HD session today, with the next scheduled on 07/26/21  Plan: start vancomycin 1000 mg IV with each HD session Next scheduled HD session 07/28/21 Target vancomycin level 15 - 25 mcg/mL prior to 3rd HD session  8/8: Vanc level 8/6= 25 mcg/ml. No change in dosing    Height: '5\' 8"'$  (172.7 cm) Weight: (!) 142.1 kg (313 lb 4.4 oz) IBW/kg (Calculated) : 63.9  Temp (24hrs), Avg:97.7 F (36.5 C), Min:97.5 F (36.4 C), Max:98.1 F (36.7 C)  Recent Labs  Lab 07/23/21 1313 07/24/21 0320 07/26/21 0443 07/26/21 0806 07/27/21 0904  WBC 10.9* 10.4 9.0  --  9.5  CREATININE 4.54* 4.99* 3.12*  --  3.06*  VANCORANDOM  --   --   --  25  --      Estimated Creatinine Clearance: 26.8 mL/min (A) (by C-G formula based on SCr of 3.06 mg/dL (H)).    Allergies  Allergen Reactions   Ciprofloxacin Other (See Comments)   Duricef [Cefadroxil] Hives   Sulfamethoxazole-Trimethoprim Other (See Comments)    Antimicrobials this admission: 08/03 vancomycin >>  08/03 ceftriaxone >>   Microbiology results: 08/03 SARS CoV-2: negative 08/03 influenza A/B: negative   Thank you for allowing pharmacy to be a part of this patient's care.  Jahquan Klugh A 07/28/2021 9:48 AM

## 2021-07-28 NOTE — Plan of Care (Signed)
PMT note:  In to see patient for [place] consult request.  Plans for D/C to facility today. Patient is sleepy. She awakens briefly upon speaking to her and quickly closes her eyes. Attempted to ask several questions including questions about family and she opened them again but closed her eyes back without answering. Would recommend palliative to follow outpatient for [place] conversation.

## 2021-07-28 NOTE — Progress Notes (Signed)
[facility]  ROUNDING NOTE   Subjective:   [name] is a 70 y.o.  female with diabetes, Atrial Fib on Coumadin, anemia, pacemaker/AICD, and ESRD on HD, who was admitted to [facility] on 07/23/2021 for Cellulitis of abdominal wall [L03.311] Ventricular tachycardia ([place]) [I47.2] ESRD (end stage renal disease) ([place]) [N18.6] Abdominal wall cellulitis [id number]  Patient resting comfortable Alert Denies shortness of breath No other complaints at this time  Objective:  Vital signs in last 24 hours:  Temp:  [97.5 F (36.4 C)-98.1 F (36.7 C)] 97.5 F (36.4 C) (08/08 0804) Pulse Rate:  [68-70] 70 (08/08 0804) Resp:  [16-20] 20 (08/08 0352) BP: (98-115)/(57-64) 109/64 (08/08 0804) SpO2:  [93 %-96 %] 96 % (08/08 0804)  Weight change:  Filed Weights   07/23/21 1304 07/23/21 2308  Weight: 131.1 kg (!) 142.1 kg    Intake/Output: I/O last 3 completed shifts: In: 297.3 [IV Piggyback:297.3] Out: 0    Intake/Output this shift:  Total I/O In: 240 [P.O.:240] Out: -   Physical Exam: General: NAD, laying in bed  Head: Normocephalic, atraumatic. Moist oral mucosal membranes  Eyes: Anicteric  Lungs:  Clear to auscultation, normal effort  Heart: Regular rate and rhythm  Abdomen:  Soft, nontender  Extremities:  1+ peripheral edema.  Neurologic: Nonfocal, moving all four extremities  Skin: No lesions  Access: Rt Permcath    Basic Metabolic Panel: Recent Labs  Lab 07/23/21 1313 07/24/21 0320 07/26/21 0443 07/27/21 0904  NA 134* 135 135 135  K 3.7 3.9 3.3* 3.4*  CL 94* 95* 97* 95*  CO2 '25 28 28 28  '$ GLUCOSE 118* 140* 143* 132*  BUN 28* 31* 14 13  CREATININE 4.54* 4.99* 3.12* 3.06*  CALCIUM 8.0* 7.8* 8.2* 8.3*  MG  --  2.3  --   --      Liver Function Tests: No results for input(s): AST, ALT, ALKPHOS, BILITOT, PROT, ALBUMIN in the last 168 hours. No results for input(s): LIPASE, AMYLASE in the last 168 hours. No results for input(s): AMMONIA in the last  168 hours.  CBC: Recent Labs  Lab 07/23/21 1313 07/24/21 0320 07/26/21 0443 07/27/21 0904  WBC 10.9* 10.4 9.0 9.5  NEUTROABS 7.7  --   --   --   HGB 9.6* 9.3* 9.6* 9.8*  HCT 32.6* 31.6* 33.6* 34.1*  MCV 90.6 91.1 91.6 91.9  PLT 228 205 221 242     Cardiac Enzymes: No results for input(s): CKTOTAL, CKMB, CKMBINDEX, TROPONINI in the last 168 hours.  BNP: Invalid input(s): POCBNP  CBG: Recent Labs  Lab 07/27/21 1143 07/27/21 1651 07/27/21 2115 07/28/21 0808 07/28/21 1159  GLUCAP 133* 152* 156* 149* 167*     Microbiology: Results for orders placed or performed during the hospital encounter of 07/23/21  Resp Panel by RT-PCR (Flu A&B, Covid) Nasopharyngeal Swab     Status: None   Collection Time: 07/23/21  9:22 PM   Specimen: Nasopharyngeal Swab; Nasopharyngeal(NP) swabs in vial transport medium  Result Value Ref Range Status   SARS Coronavirus 2 by RT PCR NEGATIVE NEGATIVE Final    Comment: (NOTE) SARS-CoV-2 target nucleic acids are NOT DETECTED.  The SARS-CoV-2 RNA is generally detectable in upper respiratory specimens during the acute phase of infection. The lowest concentration of SARS-CoV-2 viral copies this assay can detect is 138 copies/mL. A negative result does not preclude SARS-Cov-2 infection and should not be used as the sole basis for treatment or other patient management decisions. A negative result may  occur with  improper specimen collection/handling, submission of specimen other than nasopharyngeal swab, presence of viral mutation(s) within the areas targeted by this assay, and inadequate number of viral copies(<138 copies/mL). A negative result must be combined with clinical observations, patient history, and epidemiological information. The expected result is Negative.  Fact Sheet for Patients:  EntrepreneurPulse.com.au  Fact Sheet for Healthcare Providers:  IncredibleEmployment.be  This test is no t yet  approved or cleared by the [place] FDA and  has been authorized for detection and/or diagnosis of SARS-CoV-2 by FDA under an Emergency Use Authorization (EUA). This EUA will remain  in effect (meaning this test can be used) for the duration of the COVID-19 declaration under Section 564(b)(1) of the Act, 21 U.S.C.section 360bbb-3(b)(1), unless the authorization is terminated  or revoked sooner.       Influenza A by PCR NEGATIVE NEGATIVE Final   Influenza B by PCR NEGATIVE NEGATIVE Final    Comment: (NOTE) The Xpert Xpress SARS-CoV-2/FLU/RSV plus assay is intended as an aid in the diagnosis of influenza from Nasopharyngeal swab specimens and should not be used as a sole basis for treatment. Nasal washings and aspirates are unacceptable for Xpert Xpress SARS-CoV-2/FLU/RSV testing.  Fact Sheet for Patients: EntrepreneurPulse.com.au  Fact Sheet for Healthcare Providers: IncredibleEmployment.be  This test is not yet approved or cleared by the [place] FDA and has been authorized for detection and/or diagnosis of SARS-CoV-2 by FDA under an Emergency Use Authorization (EUA). This EUA will remain in effect (meaning this test can be used) for the duration of the COVID-19 declaration under Section 564(b)(1) of the Act, 21 U.S.C. section 360bbb-3(b)(1), unless the authorization is terminated or revoked.  Performed at [facility], [address] [place], [place] [address]   MRSA Next Gen by PCR, Nasal     Status: None   Collection Time: 07/27/21 12:31 PM   Specimen: Nasal Mucosa; Nasal Swab  Result Value Ref Range Status   MRSA by PCR Next Gen NOT DETECTED NOT DETECTED Final    Comment: (NOTE) The GeneXpert MRSA Assay (FDA approved for NASAL specimens only), is one component of a comprehensive MRSA colonization surveillance program. It is not intended to diagnose MRSA infection nor to guide or monitor treatment for MRSA  infections. Test performance is not FDA approved in patients less than 57 years old. Performed at [facility], [address] [place], [place] [address]   Resp Panel by RT-PCR (Flu A&B, Covid) Nasopharyngeal Swab     Status: None   Collection Time: 07/28/21 12:20 PM   Specimen: Nasopharyngeal Swab; Nasopharyngeal(NP) swabs in vial transport medium  Result Value Ref Range Status   SARS Coronavirus 2 by RT PCR NEGATIVE NEGATIVE Final    Comment: (NOTE) SARS-CoV-2 target nucleic acids are NOT DETECTED.  The SARS-CoV-2 RNA is generally detectable in upper respiratory specimens during the acute phase of infection. The lowest concentration of SARS-CoV-2 viral copies this assay can detect is 138 copies/mL. A negative result does not preclude SARS-Cov-2 infection and should not be used as the sole basis for treatment or other patient management decisions. A negative result may occur with  improper specimen collection/handling, submission of specimen other than nasopharyngeal swab, presence of viral mutation(s) within the areas targeted by this assay, and inadequate number of viral copies(<138 copies/mL). A negative result must be combined with clinical observations, patient history, and epidemiological information. The expected result is Negative.  Fact Sheet for Patients:  EntrepreneurPulse.com.au  Fact Sheet for Healthcare  Providers:  IncredibleEmployment.be  This test is no t yet approved or cleared by the [place] and  has been authorized for detection and/or diagnosis of SARS-CoV-2 by FDA under an Emergency Use Authorization (EUA). This EUA will remain  in effect (meaning this test can be used) for the duration of the COVID-19 declaration under Section 564(b)(1) of the Act, 21 U.S.C.section 360bbb-3(b)(1), unless the authorization is terminated  or revoked sooner.       Influenza A by PCR NEGATIVE NEGATIVE Final    Influenza B by PCR NEGATIVE NEGATIVE Final    Comment: (NOTE) The Xpert Xpress SARS-CoV-2/FLU/RSV plus assay is intended as an aid in the diagnosis of influenza from Nasopharyngeal swab specimens and should not be used as a sole basis for treatment. Nasal washings and aspirates are unacceptable for Xpert Xpress SARS-CoV-2/FLU/RSV testing.  Fact Sheet for Patients: EntrepreneurPulse.com.au  Fact Sheet for Healthcare Providers: IncredibleEmployment.be  This test is not yet approved or cleared by the [place] FDA and has been authorized for detection and/or diagnosis of SARS-CoV-2 by FDA under an Emergency Use Authorization (EUA). This EUA will remain in effect (meaning this test can be used) for the duration of the COVID-19 declaration under Section 564(b)(1) of the Act, 21 U.S.C. section 360bbb-3(b)(1), unless the authorization is terminated or revoked.  Performed at [facility], [address] [place], [place] [address]     Coagulation Studies: Recent Labs    07/26/21 0443 07/27/21 0437 07/28/21 0607  LABPROT 30.9* 27.5* 26.1*  INR 3.0* 2.6* 2.4*     Urinalysis: No results for input(s): COLORURINE, LABSPEC, PHURINE, GLUCOSEU, HGBUR, BILIRUBINUR, KETONESUR, PROTEINUR, UROBILINOGEN, NITRITE, LEUKOCYTESUR in the last 72 hours.  Invalid input(s): APPERANCEUR    Imaging: US ARTERIAL ABI (SCREENING LOWER EXTREMITY)  Result Date: 07/28/2021 CLINICAL DATA:  70 year old female with PA D EXAM: NONINVASIVE PHYSIOLOGIC VASCULAR STUDY OF BILATERAL LOWER EXTREMITIES TECHNIQUE: Evaluation of both lower extremities was performed at rest, including calculation of ankle-brachial indices, multiple segmental pressure evaluation, segmental Doppler and segmental pulse volume recording. COMPARISON:  None. FINDINGS: Right ABI:  0.52 Left ABI:  0.54 Right Lower Extremity: Segmental doppler at the right ankle demonstrates monophasic waveforms  Left Lower Extremity: Segmental Doppler at the left ankle demonstrates monophasic waveforms IMPRESSION: Resting ABI of the bilateral lower extremity in the moderate range arterial occlusive disease. Segmental exam at the ankles demonstrates more proximal arterial occlusive disease, with monophasic waveforms throughout. Signed, Dulcy Fanny. Dellia Nims, RPVI [facility] Electronically Signed   By: Corrie Mckusick D.O.   On: 07/28/2021 09:47     Medications:    sodium chloride Stopped (07/24/21 2351)   albumin human      vitamin C  500 mg Oral Daily   aspirin EC  81 mg Oral Daily   calcitRIOL  0.5 mcg Oral Daily   calcium carbonate  2 tablet Oral BID   Chlorhexidine Gluconate Cloth  6 each Topical Daily   diphenhydrAMINE  25 mg Intravenous Once   doxycycline  100 mg Oral Q12H   furosemide  20 mg Intravenous BID   gabapentin  100 mg Oral BID   insulin aspart  0-5 Units Subcutaneous QHS   insulin aspart  0-9 Units Subcutaneous TID WC   loratadine  10 mg Oral Daily   midodrine  10 mg Oral TID with meals   warfarin  2.5 mg Oral q1600   Warfarin - Pharmacist Dosing Inpatient   Does not apply  q1600   sodium chloride, acetaminophen, zolpidem  Assessment/ Plan:  [name] is a 70 y.o.  female  with diabetes, Atrial Fib on Coumadin, anemia, pacemaker/AICD, and ESRD on HD, who was admitted to [facility] on 07/23/2021 for Cellulitis of abdominal wall [L03.311] Ventricular tachycardia ([place]) [I47.2] ESRD (end stage renal disease) ([place]) [N18.6] Abdominal wall cellulitis [id number]   [facility] [address]/TTS/Rt Permcath  End stage renal disease on dialysis Will maintain the outpatient schedule, if possible Received extra treatment last week. Total UF removed over those three days of treatment is 6.7L. Next treatment scheduled for Tuesday. Will continue to monitor and adjust plan as needed.  2. Anemia of chronic kidney disease Lab Results   Component Value Date   HGB 9.8 (L) 07/27/2021   Hgb within acceptable range Will monitor labs  3. Secondary Hyperparathyroidism: Lab Results  Component Value Date   CALCIUM 8.3 (L) 07/27/2021   Calcium not at target Calcium carbonate BID    LOS: 4 Taiz Bickle 8/8/20221:38 PM

## 2021-07-28 NOTE — Plan of Care (Signed)
  Problem: Clinical Measurements: Goal: Ability to avoid or minimize complications of infection will improve Outcome: Progressing   Problem: Skin Integrity: Goal: Skin integrity will improve Outcome: Not Progressing

## 2021-07-28 NOTE — Discharge Summary (Signed)
[facility] NAME: Linda Quinn    MR#:  [id number]  DATE OF BIRTH:  05/28/1951  DATE OF ADMISSION:  07/23/2021 ADMITTING PHYSICIAN: Loletha Grayer, MD  DATE OF DISCHARGE: 07/28/2021  PRIMARY CARE PHYSICIAN: Pcp, No    ADMISSION DIAGNOSIS:  Cellulitis of abdominal wall [L03.311] Ventricular tachycardia (HCC) [I47.2] ESRD (end stage renal disease) ([place]) [N18.6] Abdominal wall cellulitis [id number]  DISCHARGE DIAGNOSIS:  Acute on chronic systolic heart failure with Anasarca End-stage renal disease on hemodialysis Transient episode of ventricular tachycardia. Patient has AICD Abdominal wall diffuse edema with chronic drainage from previous operative site Paroxysmal atrial fibrillation on chronic anticoagulation  SECONDARY DIAGNOSIS:   Past Medical History:  Diagnosis Date   CHF (congestive heart failure) (HCC)    Diabetes mellitus without complication (HCC)    Paroxysmal atrial fibrillation (HCC)    Renal disorder    Respiratory failure ([place])    Sleep apnea     HOSPITAL COURSE:  Linda Quinn is a 70 y.o.  female with diabetes, Atrial Fib on Coumadin, anemia, pacemaker/AICD, and ESRD on HD, who was admitted to [facility] on 07/23/2021 for Cellulitis of abdominal wall.    Acute on chronic systolic heart failure with anasarca: --Last echocardiogram showed LVEF of less than 20%. --Volume status managed with HD. --sats 96% on RA   Transient episode of Ventricular Tachycardia: AICD firing --Cardiology consulted, no further recommendations. --Continue with amiodarone for ventricular arrhythmias.   Abdominal wall cellulitis with drainage from previous operative site (per pt it is chronic): --She was started on broad spectrum IV antibiotics. -- Remains afebrile. No foul-smelling discharge. White count normal. Will change to PO doxycycline to complete a 10 day course. -- local dressing changes as per instruction  ESRD on  HD: Nephrology on board-- patient will resume her outpatient dialysis schedule as before   Paroxysmal atrial fibrillation: --Rate controlled with amiodarone. --On coumadin for anti coagulation with therapeutic INR.   Morbid obesity:  Body mass index is 47.63 kg/m.    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 -- Resume sliding scale insulin and Lantus home dose     Pressure injury: Present on admission.  Pressure Injury 07/23/21 Ankle Left;Posterior Stage 2 -  Partial thickness loss of dermis presenting as a shallow open injury with a red, pink wound bed without slough. epithelialized (Active)  07/23/21 2315  Location: Ankle  Location Orientation: Left;Posterior  Staging: Stage 2 -  Partial thickness loss of dermis presenting as a shallow open injury with a red, pink wound bed without slough.  Wound Description (Comments): epithelialized  Present on Admission: Yes     Pressure Injury Leg Right;Posterior Stage 2 -  Partial thickness loss of dermis presenting as a shallow open injury with a red, pink wound bed without slough. epithelialized (Active)     Location: Leg  Location Orientation: Right;Posterior  Staging: Stage 2 -  Partial thickness loss of dermis presenting as a shallow open injury with a red, pink wound bed without slough.  Wound Description (Comments): epithelialized  Present on Admission: Yes    patient currently appears to be optimize at baseline. She will discharge back to [facility].         DVT prophylaxis: (Heparin) Code Status: (Full Code) Family Communication: none at bedside.  Disposition:    Status is: Inpatient     Dispo: The patient is from: Home  Anticipated d/c is to: SNF              Patient currently is medically optimized to d/c.              Difficult to place patient No    CONSULTS OBTAINED:    DRUG ALLERGIES:   Allergies  Allergen Reactions   Ciprofloxacin Other (See Comments)    Duricef [Cefadroxil] Hives   Sulfamethoxazole-Trimethoprim Other (See Comments)    DISCHARGE MEDICATIONS:   Allergies as of 07/28/2021       Reactions   Ciprofloxacin Other (See Comments)   Duricef [cefadroxil] Hives   Sulfamethoxazole-trimethoprim Other (See Comments)        Medication List     TAKE these medications    aspirin EC 81 MG tablet Take 81 mg by mouth daily. Swallow whole.   calcitRIOL 0.5 MCG capsule Commonly known as: ROCALTROL Take 0.5 mcg by mouth daily.   calcium carbonate 500 MG chewable tablet Commonly known as: TUMS - dosed in mg elemental calcium Chew 2 tablets (400 mg of elemental calcium total) by mouth 2 (two) times daily.   doxycycline 100 MG tablet Commonly known as: VIBRA-TABS Take 1 tablet (100 mg total) by mouth every 12 (twelve) hours for 5 days.   gabapentin 100 MG capsule Commonly known as: NEURONTIN Take 100 mg by mouth 2 (two) times daily.   HumaLOG KwikPen 100 UNIT/ML KwikPen Generic drug: insulin lispro Inject 0-10 Units into the skin 3 (three) times daily.   insulin glargine 100 UNIT/ML injection Commonly known as: LANTUS Inject 10 Units into the skin daily.   lidocaine 5 % Commonly known as: LIDODERM Place 1 patch onto the skin daily. Remove & Discard patch within 12 hours or as directed by MD   loratadine 10 MG tablet Commonly known as: CLARITIN Take 10 mg by mouth daily.   melatonin 3 MG Tabs tablet Take 3 mg by mouth at bedtime.   midodrine 10 MG tablet Commonly known as: PROAMATINE Take 10 mg by mouth 3 (three) times daily.   polyethylene glycol 17 g packet Commonly known as: MIRALAX / GLYCOLAX Take 17 g by mouth daily.   senna-docusate 8.6-50 MG tablet Commonly known as: Senokot-S Take 2 tablets by mouth at bedtime.   vitamin C 500 MG tablet Commonly known as: ASCORBIC ACID Take 500 mg by mouth daily.   warfarin 2.5 MG tablet Commonly known as: COUMADIN Take 2.5 mg by mouth daily.        If  you experience worsening of your admission symptoms, develop shortness of breath, life threatening emergency, suicidal or homicidal thoughts you must seek medical attention immediately by calling 911 or calling your MD immediately  if symptoms less severe.  You Must read complete instructions/literature along with all the possible adverse reactions/side effects for all the Medicines you take and that have been prescribed to you. Take any new Medicines after you have completely understood and accept all the possible adverse reactions/side effects.   Please note  You were cared for by a hospitalist during your hospital stay. If you have any questions about your discharge medications or the care you received while you were in the hospital after you are discharged, you can call the unit and asked to speak with the hospitalist on call if the hospitalist that took care of you is not available. Once you are discharged, your primary care physician will handle any further medical issues. Please note that NO REFILLS for any discharge  medications will be authorized once you are discharged, as it is imperative that you return to your primary care physician (or establish a relationship with a primary care physician if you do not have one) for your aftercare needs so that they can reassess your need for medications and monitor your lab values. Today   SUBJECTIVE   overall stable. Denies any complaints.  VITAL SIGNS:  Blood pressure 109/64, pulse 70, temperature (!) 97.5 F (36.4 C), temperature source Oral, resp. rate 20, height '5\' 8"'$  (1.727 m), weight (!) 142.1 kg, SpO2 96 %.  I/O:   Intake/Output Summary (Last 24 hours) at 07/28/2021 1216 Last data filed at 07/28/2021 1030 Gross per 24 hour  Intake 537.31 ml  Output 0 ml  Net 537.31 ml    PHYSICAL EXAMINATION:  GENERAL:  70 y.o.-year-old patient lying in the bed with no acute distress. Morbidly obese LUNGS: Normal breath sounds bilaterally, no wheezing,  rales,rhonchi or crepitation. No use of accessory muscles of respiration.  CARDIOVASCULAR: S1, S2 normal. No murmurs, rubs, or gallops.  ABDOMEN: significant abdominal obesity with abdominal wall edema. Patient has a midline surgical scar with Estill Bamberg and wound keeping. No foul-smelling discharge. Dressing present.  EXTREMITIES: chronic 3+ edema NEUROLOGIC: nonfocal PSYCHIATRIC: The patient is alert and oriented x 3.  SKIN: as below  DATA REVIEW:   CBC  Recent Labs  Lab 07/27/21 0904  WBC 9.5  HGB 9.8*  HCT 34.1*  PLT 242    Chemistries  Recent Labs  Lab 07/24/21 0320 07/26/21 0443 07/27/21 0904  NA 135   < > 135  K 3.9   < > 3.4*  CL 95*   < > 95*  CO2 28   < > 28  GLUCOSE 140*   < > 132*  BUN 31*   < > 13  CREATININE 4.99*   < > 3.06*  CALCIUM 7.8*   < > 8.3*  MG 2.3  --   --    < > = values in this interval not displayed.    Microbiology Results   Recent Results (from the past 240 hour(s))  Resp Panel by RT-PCR (Flu A&B, Covid) Nasopharyngeal Swab     Status: None   Collection Time: 07/23/21  9:22 PM   Specimen: Nasopharyngeal Swab; Nasopharyngeal(NP) swabs in vial transport medium  Result Value Ref Range Status   SARS Coronavirus 2 by RT PCR NEGATIVE NEGATIVE Final    Comment: (NOTE) SARS-CoV-2 target nucleic acids are NOT DETECTED.  The SARS-CoV-2 RNA is generally detectable in upper respiratory specimens during the acute phase of infection. The lowest concentration of SARS-CoV-2 viral copies this assay can detect is 138 copies/mL. A negative result does not preclude SARS-Cov-2 infection and should not be used as the sole basis for treatment or other patient management decisions. A negative result may occur with  improper specimen collection/handling, submission of specimen other than nasopharyngeal swab, presence of viral mutation(s) within the areas targeted by this assay, and inadequate number of viral copies(<138 copies/mL). A negative result must be  combined with clinical observations, patient history, and epidemiological information. The expected result is Negative.  Fact Sheet for Patients:  EntrepreneurPulse.com.au  Fact Sheet for Healthcare Providers:  IncredibleEmployment.be  This test is no t yet approved or cleared by the [place] FDA and  has been authorized for detection and/or diagnosis of SARS-CoV-2 by FDA under an Emergency Use Authorization (EUA). This EUA will remain  in effect (meaning this test can be used) for  the duration of the COVID-19 declaration under Section 564(b)(1) of the Act, 21 U.S.C.section 360bbb-3(b)(1), unless the authorization is terminated  or revoked sooner.       Influenza A by PCR NEGATIVE NEGATIVE Final   Influenza B by PCR NEGATIVE NEGATIVE Final    Comment: (NOTE) The Xpert Xpress SARS-CoV-2/FLU/RSV plus assay is intended as an aid in the diagnosis of influenza from Nasopharyngeal swab specimens and should not be used as a sole basis for treatment. Nasal washings and aspirates are unacceptable for Xpert Xpress SARS-CoV-2/FLU/RSV testing.  Fact Sheet for Patients: EntrepreneurPulse.com.au  Fact Sheet for Healthcare Providers: IncredibleEmployment.be  This test is not yet approved or cleared by the [place] FDA and has been authorized for detection and/or diagnosis of SARS-CoV-2 by FDA under an Emergency Use Authorization (EUA). This EUA will remain in effect (meaning this test can be used) for the duration of the COVID-19 declaration under Section 564(b)(1) of the Act, 21 U.S.C. section 360bbb-3(b)(1), unless the authorization is terminated or revoked.  Performed at [facility], [address] [place], [place] [address]   MRSA Next Gen by PCR, Nasal     Status: None   Collection Time: 07/27/21 12:31 PM   Specimen: Nasal Mucosa; Nasal Swab  Result Value Ref Range Status   MRSA by PCR  Next Gen NOT DETECTED NOT DETECTED Final    Comment: (NOTE) The GeneXpert MRSA Assay (FDA approved for NASAL specimens only), is one component of a comprehensive MRSA colonization surveillance program. It is not intended to diagnose MRSA infection nor to guide or monitor treatment for MRSA infections. Test performance is not FDA approved in patients less than 98 years old. Performed at [facility], [address] [place], [place] [address]     RADIOLOGY:  US ARTERIAL ABI (SCREENING LOWER EXTREMITY)  Result Date: 07/28/2021 CLINICAL DATA:  70 year old female with PA D EXAM: NONINVASIVE PHYSIOLOGIC VASCULAR STUDY OF BILATERAL LOWER EXTREMITIES TECHNIQUE: Evaluation of both lower extremities was performed at rest, including calculation of ankle-brachial indices, multiple segmental pressure evaluation, segmental Doppler and segmental pulse volume recording. COMPARISON:  None. FINDINGS: Right ABI:  0.52 Left ABI:  0.54 Right Lower Extremity: Segmental doppler at the right ankle demonstrates monophasic waveforms Left Lower Extremity: Segmental Doppler at the left ankle demonstrates monophasic waveforms IMPRESSION: Resting ABI of the bilateral lower extremity in the moderate range arterial occlusive disease. Segmental exam at the ankles demonstrates more proximal arterial occlusive disease, with monophasic waveforms throughout. Signed, Dulcy Fanny. Dellia Nims, RPVI [facility] Electronically Signed   By: Corrie Mckusick D.O.   On: 07/28/2021 09:47     CODE STATUS:     Code Status Orders  (From admission, onward)           Start     Ordered   07/23/21 2134  Full code  Continuous        07/23/21 2134           Code Status History     This patient has a current code status but no historical code status.        TOTAL TIME TAKING CARE OF THIS PATIENT: 35 minutes.    Fritzi Mandes M.D  [facility]     CC: Primary care physician; Pcp, No

## 2021-07-29 LAB — GLUCOSE, CAPILLARY
Glucose-Capillary: 118 mg/dL — ABNORMAL HIGH (ref 70–99)
Glucose-Capillary: 112 mg/dL — ABNORMAL HIGH (ref 70–99)

## 2021-07-29 MED ORDER — ALBUMIN HUMAN 25 % IV SOLN
INTRAVENOUS | Status: AC
  Filled 2021-07-29: qty 50

## 2021-07-29 MED ORDER — COVID-19 MRNA VAC-TRIS(PFIZER) 30 MCG/0.3ML IM SUSP
0.3000 mL | Freq: Once | INTRAMUSCULAR | Status: AC
  Administered 2021-07-29: 0.3 mL via INTRAMUSCULAR
  Filled 2021-07-29: qty 0.3

## 2021-07-29 NOTE — Progress Notes (Signed)
OT Cancellation Note  Patient Details Name: Linda Quinn MRN: [id number] DOB: Aug 15, 1951   Cancelled Treatment:    Reason Eval/Treat Not Completed: Patient at procedure or test/ unavailable. Pt currently off the floor for HD, will continue per POC and follow up at later date/time as able.  Dessie Coma, M.S. OTR/L  07/29/21, 12:28 PM  ascom [phone]

## 2021-07-29 NOTE — Progress Notes (Signed)
[facility]  ROUNDING NOTE   Subjective:   [name] is a 70 y.o.  female with diabetes, Atrial Fib on Coumadin, anemia, pacemaker/AICD, and ESRD on HD, who was admitted to [facility] on 07/23/2021 for Cellulitis of abdominal wall [L03.311] Ventricular tachycardia ([place]) [I47.2] ESRD (end stage renal disease) ([place]) [N18.6] Abdominal wall cellulitis [id number]  Patient seen during dialysis   HEMODIALYSIS FLOWSHEET:  Blood Flow Rate (mL/min): 200 mL/min Arterial Pressure (mmHg): -70 mmHg Venous Pressure (mmHg): 60 mmHg Transmembrane Pressure (mmHg): 70 mmHg Ultrafiltration Rate (mL/min): 70 mL/min Dialysate Flow Rate (mL/min): 500 ml/min Conductivity: Machine : 14 Conductivity: Machine : 14 Dialysis Fluid Bolus: Normal Saline Bolus Amount (mL): 300 mL  Patient has no complaints at this time Hypotensive during treatment, Albumin given Treatment terminated early during to clotting  Objective:  Vital signs in last 24 hours:  Temp:  [97.8 F (36.6 C)-98.2 F (36.8 C)] 98 F (36.7 C) (08/09 1258) Pulse Rate:  [68-78] 78 (08/09 1258) Resp:  [17-20] 18 (08/09 1258) BP: (65-142)/(25-75) 133/26 (08/09 1258) SpO2:  [88 %-100 %] 100 % (08/09 1258)  Weight change:  Filed Weights   07/23/21 1304 07/23/21 2308  Weight: 131.1 kg (!) 142.1 kg    Intake/Output: I/O last 3 completed shifts: In: 537.3 [P.O.:240; IV Piggyback:297.3] Out: 0    Intake/Output this shift:  No intake/output data recorded.  Physical Exam: General: NAD, laying in bed  Head: Normocephalic, atraumatic. Moist oral mucosal membranes  Eyes: Anicteric  Lungs:  Clear to auscultation, normal effort  Heart: Regular rate and rhythm  Abdomen:  Soft, nontender  Extremities:  1+ peripheral edema.  Neurologic: Nonfocal, moving all four extremities  Skin: No lesions  Access: Rt Permcath    Basic Metabolic Panel: Recent Labs  Lab 07/23/21 1313 07/24/21 0320 07/26/21 0443 07/27/21 0904  NA  134* 135 135 135  K 3.7 3.9 3.3* 3.4*  CL 94* 95* 97* 95*  CO2 '25 28 28 28  '$ GLUCOSE 118* 140* 143* 132*  BUN 28* 31* 14 13  CREATININE 4.54* 4.99* 3.12* 3.06*  CALCIUM 8.0* 7.8* 8.2* 8.3*  MG  --  2.3  --   --      Liver Function Tests: No results for input(s): AST, ALT, ALKPHOS, BILITOT, PROT, ALBUMIN in the last 168 hours. No results for input(s): LIPASE, AMYLASE in the last 168 hours. No results for input(s): AMMONIA in the last 168 hours.  CBC: Recent Labs  Lab 07/23/21 1313 07/24/21 0320 07/26/21 0443 07/27/21 0904  WBC 10.9* 10.4 9.0 9.5  NEUTROABS 7.7  --   --   --   HGB 9.6* 9.3* 9.6* 9.8*  HCT 32.6* 31.6* 33.6* 34.1*  MCV 90.6 91.1 91.6 91.9  PLT 228 205 221 242     Cardiac Enzymes: No results for input(s): CKTOTAL, CKMB, CKMBINDEX, TROPONINI in the last 168 hours.  BNP: Invalid input(s): POCBNP  CBG: Recent Labs  Lab 07/28/21 1159 07/28/21 1706 07/28/21 2109 07/29/21 0755 07/29/21 1300  GLUCAP 167* 155* 159* 118* 112*     Microbiology: Results for orders placed or performed during the hospital encounter of 07/23/21  Resp Panel by RT-PCR (Flu A&B, Covid) Nasopharyngeal Swab     Status: None   Collection Time: 07/23/21  9:22 PM   Specimen: Nasopharyngeal Swab; Nasopharyngeal(NP) swabs in vial transport medium  Result Value Ref Range Status   SARS Coronavirus 2 by RT PCR NEGATIVE NEGATIVE Final    Comment: (NOTE) SARS-CoV-2 target nucleic acids are  NOT DETECTED.  The SARS-CoV-2 RNA is generally detectable in upper respiratory specimens during the acute phase of infection. The lowest concentration of SARS-CoV-2 viral copies this assay can detect is 138 copies/mL. A negative result does not preclude SARS-Cov-2 infection and should not be used as the sole basis for treatment or other patient management decisions. A negative result may occur with  improper specimen collection/handling, submission of specimen other than nasopharyngeal swab,  presence of viral mutation(s) within the areas targeted by this assay, and inadequate number of viral copies(<138 copies/mL). A negative result must be combined with clinical observations, patient history, and epidemiological information. The expected result is Negative.  Fact Sheet for Patients:  EntrepreneurPulse.com.au  Fact Sheet for Healthcare Providers:  IncredibleEmployment.be  This test is no t yet approved or cleared by the [place] FDA and  has been authorized for detection and/or diagnosis of SARS-CoV-2 by FDA under an Emergency Use Authorization (EUA). This EUA will remain  in effect (meaning this test can be used) for the duration of the COVID-19 declaration under Section 564(b)(1) of the Act, 21 U.S.C.section 360bbb-3(b)(1), unless the authorization is terminated  or revoked sooner.       Influenza A by PCR NEGATIVE NEGATIVE Final   Influenza B by PCR NEGATIVE NEGATIVE Final    Comment: (NOTE) The Xpert Xpress SARS-CoV-2/FLU/RSV plus assay is intended as an aid in the diagnosis of influenza from Nasopharyngeal swab specimens and should not be used as a sole basis for treatment. Nasal washings and aspirates are unacceptable for Xpert Xpress SARS-CoV-2/FLU/RSV testing.  Fact Sheet for Patients: EntrepreneurPulse.com.au  Fact Sheet for Healthcare Providers: IncredibleEmployment.be  This test is not yet approved or cleared by the [place] FDA and has been authorized for detection and/or diagnosis of SARS-CoV-2 by FDA under an Emergency Use Authorization (EUA). This EUA will remain in effect (meaning this test can be used) for the duration of the COVID-19 declaration under Section 564(b)(1) of the Act, 21 U.S.C. section 360bbb-3(b)(1), unless the authorization is terminated or revoked.  Performed at [facility], [address] [place],  [address]   MRSA Next Gen  by PCR, Nasal     Status: None   Collection Time: 07/27/21 12:31 PM   Specimen: Nasal Mucosa; Nasal Swab  Result Value Ref Range Status   MRSA by PCR Next Gen NOT DETECTED NOT DETECTED Final    Comment: (NOTE) The GeneXpert MRSA Assay (FDA approved for NASAL specimens only), is one component of a comprehensive MRSA colonization surveillance program. It is not intended to diagnose MRSA infection nor to guide or monitor treatment for MRSA infections. Test performance is not FDA approved in patients less than 38 years old. Performed at [facility], [address] [place],  [address]   Resp Panel by RT-PCR (Flu A&B, Covid) Nasopharyngeal Swab     Status: None   Collection Time: 07/28/21 12:20 PM   Specimen: Nasopharyngeal Swab; Nasopharyngeal(NP) swabs in vial transport medium  Result Value Ref Range Status   SARS Coronavirus 2 by RT PCR NEGATIVE NEGATIVE Final    Comment: (NOTE) SARS-CoV-2 target nucleic acids are NOT DETECTED.  The SARS-CoV-2 RNA is generally detectable in upper respiratory specimens during the acute phase of infection. The lowest concentration of SARS-CoV-2 viral copies this assay can detect is 138 copies/mL. A negative result does not preclude SARS-Cov-2 infection and should not be used as the sole basis for treatment or other patient management decisions. A negative result may occur  with  improper specimen collection/handling, submission of specimen other than nasopharyngeal swab, presence of viral mutation(s) within the areas targeted by this assay, and inadequate number of viral copies(<138 copies/mL). A negative result must be combined with clinical observations, patient history, and epidemiological information. The expected result is Negative.  Fact Sheet for Patients:  EntrepreneurPulse.com.au  Fact Sheet for Healthcare Providers:  IncredibleEmployment.be  This test is no t yet approved or cleared by  the [place] FDA and  has been authorized for detection and/or diagnosis of SARS-CoV-2 by FDA under an Emergency Use Authorization (EUA). This EUA will remain  in effect (meaning this test can be used) for the duration of the COVID-19 declaration under Section 564(b)(1) of the Act, 21 U.S.C.section 360bbb-3(b)(1), unless the authorization is terminated  or revoked sooner.       Influenza A by PCR NEGATIVE NEGATIVE Final   Influenza B by PCR NEGATIVE NEGATIVE Final    Comment: (NOTE) The Xpert Xpress SARS-CoV-2/FLU/RSV plus assay is intended as an aid in the diagnosis of influenza from Nasopharyngeal swab specimens and should not be used as a sole basis for treatment. Nasal washings and aspirates are unacceptable for Xpert Xpress SARS-CoV-2/FLU/RSV testing.  Fact Sheet for Patients: EntrepreneurPulse.com.au  Fact Sheet for Healthcare Providers: IncredibleEmployment.be  This test is not yet approved or cleared by the [place] FDA and has been authorized for detection and/or diagnosis of SARS-CoV-2 by FDA under an Emergency Use Authorization (EUA). This EUA will remain in effect (meaning this test can be used) for the duration of the COVID-19 declaration under Section 564(b)(1) of the Act, 21 U.S.C. section 360bbb-3(b)(1), unless the authorization is terminated or revoked.  Performed at [facility], [address] [place], [place] [address]     Coagulation Studies: Recent Labs    07/27/21 0437 07/28/21 0607  LABPROT 27.5* 26.1*  INR 2.6* 2.4*     Urinalysis: No results for input(s): COLORURINE, LABSPEC, PHURINE, GLUCOSEU, HGBUR, BILIRUBINUR, KETONESUR, PROTEINUR, UROBILINOGEN, NITRITE, LEUKOCYTESUR in the last 72 hours.  Invalid input(s): APPERANCEUR    Imaging: US ARTERIAL ABI (SCREENING LOWER EXTREMITY)  Result Date: 07/28/2021 CLINICAL DATA:  70 year old female with PA D EXAM: NONINVASIVE PHYSIOLOGIC  VASCULAR STUDY OF BILATERAL LOWER EXTREMITIES TECHNIQUE: Evaluation of both lower extremities was performed at rest, including calculation of ankle-brachial indices, multiple segmental pressure evaluation, segmental Doppler and segmental pulse volume recording. COMPARISON:  None. FINDINGS: Right ABI:  0.52 Left ABI:  0.54 Right Lower Extremity: Segmental doppler at the right ankle demonstrates monophasic waveforms Left Lower Extremity: Segmental Doppler at the left ankle demonstrates monophasic waveforms IMPRESSION: Resting ABI of the bilateral lower extremity in the moderate range arterial occlusive disease. Segmental exam at the ankles demonstrates more proximal arterial occlusive disease, with monophasic waveforms throughout. Signed, Dulcy Fanny. Dellia Nims, RPVI [facility] Electronically Signed   By: Corrie Mckusick D.O.   On: 07/28/2021 09:47     Medications:    sodium chloride Stopped (07/24/21 2351)   albumin human      vitamin C  500 mg Oral Daily   aspirin EC  81 mg Oral Daily   calcitRIOL  0.5 mcg Oral Daily   calcium carbonate  2 tablet Oral BID   Chlorhexidine Gluconate Cloth  6 each Topical Daily   diphenhydrAMINE  25 mg Intravenous Once   doxycycline  100 mg Oral Q12H   furosemide  20 mg Intravenous BID   gabapentin  100 mg Oral BID  insulin aspart  0-5 Units Subcutaneous QHS   insulin aspart  0-9 Units Subcutaneous TID WC   loratadine  10 mg Oral Daily   midodrine  10 mg Oral TID with meals   warfarin  2.5 mg Oral q1600   Warfarin - Pharmacist Dosing Inpatient   Does not apply q1600   sodium chloride, acetaminophen, zolpidem  Assessment/ Plan:  [name] is a 70 y.o.  female  with diabetes, Atrial Fib on Coumadin, anemia, pacemaker/AICD, and ESRD on HD, who was admitted to [facility] on 07/23/2021 for Cellulitis of abdominal wall [L03.311] Ventricular tachycardia ([place]) [I47.2] ESRD (end stage renal disease) ([place])  [N18.6] Abdominal wall cellulitis [id number]   [facility] [address]/TTS/Rt Permcath  End stage renal disease on dialysis Will maintain the outpatient schedule, if possible Received extra treatment last week. Total UF removed over those three days of treatment is 6.7L. Received dialysis today. Hypotension and given Albumin. UF turned off after 1 hour of treatment and cleaning continued for remaining 2 hours. Next treatment scheduled for Thursday, if remains inpatient.  2. Anemia of chronic kidney disease Lab Results  Component Value Date   HGB 9.8 (L) 07/27/2021   Hgb within acceptable range Will monitor labs  3. Secondary Hyperparathyroidism: Lab Results  Component Value Date   CALCIUM 8.3 (L) 07/27/2021   Calcium not at target Calcium carbonate BID    LOS: 5 Onesimo Lingard 8/9/20221:32 PM

## 2021-07-29 NOTE — Discharge Summary (Signed)
[place] [facility] [place] NAME: Linda Quinn    MR#:  [id number]  DATE OF BIRTH:  1951/06/30  DATE OF ADMISSION:  07/23/2021 ADMITTING PHYSICIAN: Loletha Grayer, MD  DATE OF DISCHARGE: 07/29/2021  PRIMARY CARE PHYSICIAN: Pcp, No    ADMISSION DIAGNOSIS:  Cellulitis of abdominal wall [L03.311] Ventricular tachycardia (HCC) [I47.2] ESRD (end stage renal disease) ([place]) [N18.6] Abdominal wall cellulitis [id number]  DISCHARGE DIAGNOSIS:  Acute on chronic systolic heart failure with Anasarca End-stage renal disease on hemodialysis Transient episode of ventricular tachycardia. Patient has AICD Abdominal wall diffuse edema with chronic drainage from previous operative site Paroxysmal atrial fibrillation on chronic anticoagulation  SECONDARY DIAGNOSIS:   Past Medical History:  Diagnosis Date  . CHF (congestive heart failure) ([place])   . Diabetes mellitus without complication ([place])   . Paroxysmal atrial fibrillation (HCC)   . Renal disorder   . Respiratory failure ([place])   . Sleep apnea     HOSPITAL COURSE:  Linda Quinn is a 70 y.o.  female with diabetes, Atrial Fib on Coumadin, anemia, pacemaker/AICD, and ESRD on HD, who was admitted to [facility] on 07/23/2021 for Cellulitis of abdominal wall.    Acute on chronic systolic heart failure with anasarca: --Last echocardiogram showed LVEF of less than 20%. --Volume status managed with HD. --sats 96% on RA   Transient episode of Ventricular Tachycardia: AICD firing --Cardiology consulted, no further recommendations. --Continue with amiodarone for ventricular arrhythmias.   Abdominal wall cellulitis with drainage from previous operative site (per pt it is chronic): --She was started on broad spectrum IV antibiotics. -- Remains afebrile. No foul-smelling discharge. White count normal. Will change to PO doxycycline to complete a 10 day course. -- local dressing changes as per instruction  ESRD on  HD: Nephrology on board-- patient will resume her outpatient dialysis schedule as before   Paroxysmal atrial fibrillation: --Rate controlled with amiodarone. --On coumadin for anti coagulation with therapeutic INR.   Morbid obesity:  Body mass index is 47.63 kg/m.    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 -- Resume sliding scale insulin and Lantus home dose     Pressure injury: Present on admission.  Pressure Injury 07/23/21 Ankle Left;Posterior Stage 2 -  Partial thickness loss of dermis presenting as a shallow open injury with a red, pink wound bed without slough. epithelialized (Active)  07/23/21 2315  Location: Ankle  Location Orientation: Left;Posterior  Staging: Stage 2 -  Partial thickness loss of dermis presenting as a shallow open injury with a red, pink wound bed without slough.  Wound Description (Comments): epithelialized  Present on Admission: Yes     Pressure Injury Leg Right;Posterior Stage 2 -  Partial thickness loss of dermis presenting as a shallow open injury with a red, pink wound bed without slough. epithelialized (Active)     Location: Leg  Location Orientation: Right;Posterior  Staging: Stage 2 -  Partial thickness loss of dermis presenting as a shallow open injury with a red, pink wound bed without slough.  Wound Description (Comments): epithelialized  Present on Admission: Yes    patient currently appears to be optimize at baseline. She will discharge back to [facility] today after HD.  Pt was unable to d/c yday due to some bed issues at [facility]     Patient will benefit from Palliative care to follow as out pt to discuss [place].     DVT prophylaxis: (Heparin) Code Status: (Full Code) Family  Communication: none at bedside.  Disposition:    Status is: Inpatient     Dispo: The patient is from: Home              Anticipated d/c is to: SNF              Patient currently is medically optimized to d/c.               Difficult to place patient No    CONSULTS OBTAINED:    DRUG ALLERGIES:   Allergies  Allergen Reactions  . Ciprofloxacin Other (See Comments)  . Duricef [Cefadroxil] Hives  . Sulfamethoxazole-Trimethoprim Other (See Comments)    DISCHARGE MEDICATIONS:   Allergies as of 07/29/2021       Reactions   Ciprofloxacin Other (See Comments)   Duricef [cefadroxil] Hives   Sulfamethoxazole-trimethoprim Other (See Comments)        Medication List     TAKE these medications    aspirin EC 81 MG tablet Take 81 mg by mouth daily. Swallow whole.   calcitRIOL 0.5 MCG capsule Commonly known as: ROCALTROL Take 0.5 mcg by mouth daily.   calcium carbonate 500 MG chewable tablet Commonly known as: TUMS - dosed in mg elemental calcium Chew 2 tablets (400 mg of elemental calcium total) by mouth 2 (two) times daily.   doxycycline 100 MG tablet Commonly known as: VIBRA-TABS Take 1 tablet (100 mg total) by mouth every 12 (twelve) hours for 5 days.   gabapentin 100 MG capsule Commonly known as: NEURONTIN Take 100 mg by mouth 2 (two) times daily.   HumaLOG KwikPen 100 UNIT/ML KwikPen Generic drug: insulin lispro Inject 0-10 Units into the skin 3 (three) times daily.   insulin glargine 100 UNIT/ML injection Commonly known as: LANTUS Inject 10 Units into the skin daily.   lidocaine 5 % Commonly known as: LIDODERM Place 1 patch onto the skin daily. Remove & Discard patch within 12 hours or as directed by MD   loratadine 10 MG tablet Commonly known as: CLARITIN Take 10 mg by mouth daily.   melatonin 3 MG Tabs tablet Take 3 mg by mouth at bedtime.   midodrine 10 MG tablet Commonly known as: PROAMATINE Take 10 mg by mouth 3 (three) times daily.   polyethylene glycol 17 g packet Commonly known as: MIRALAX / GLYCOLAX Take 17 g by mouth daily.   senna-docusate 8.6-50 MG tablet Commonly known as: Senokot-S Take 2 tablets by mouth at bedtime.   vitamin C 500 MG tablet Commonly  known as: ASCORBIC ACID Take 500 mg by mouth daily.   warfarin 2.5 MG tablet Commonly known as: COUMADIN Take 2.5 mg by mouth daily.        If you experience worsening of your admission symptoms, develop shortness of breath, life threatening emergency, suicidal or homicidal thoughts you must seek medical attention immediately by calling 911 or calling your MD immediately  if symptoms less severe.  You Must read complete instructions/literature along with all the possible adverse reactions/side effects for all the Medicines you take and that have been prescribed to you. Take any new Medicines after you have completely understood and accept all the possible adverse reactions/side effects.   Please note  You were cared for by a hospitalist during your hospital stay. If you have any questions about your discharge medications or the care you received while you were in the hospital after you are discharged, you can call the unit and asked to speak with the hospitalist  on call if the hospitalist that took care of you is not available. Once you are discharged, your primary care physician will handle any further medical issues. Please note that NO REFILLS for any discharge medications will be authorized once you are discharged, as it is imperative that you return to your primary care physician (or establish a relationship with a primary care physician if you do not have one) for your aftercare needs so that they can reassess your need for medications and monitor your lab values. Today   SUBJECTIVE   overall stable. Denies any complaints.  VITAL SIGNS:  Blood pressure (!) 100/25, pulse 69, temperature 98.2 F (36.8 C), temperature source Oral, resp. rate 18, height '5\' 8"'$  (1.727 m), weight (!) 142.1 kg, SpO2 94 %.  I/O:   Intake/Output Summary (Last 24 hours) at 07/29/2021 [id number] Last data filed at 07/28/2021 2309 Gross per 24 hour  Intake 240 ml  Output 0 ml  Net 240 ml     PHYSICAL  EXAMINATION:  GENERAL:  70 y.o.-year-old patient lying in the bed with no acute distress. Morbidly obese LUNGS: Normal breath sounds bilaterally, no wheezing, rales,rhonchi or crepitation. No use of accessory muscles of respiration.  CARDIOVASCULAR: S1, S2 normal. No murmurs, rubs, or gallops.  ABDOMEN: significant abdominal obesity with abdominal wall edema. Patient has a midline surgical scar with Estill Bamberg and wound keeping. No foul-smelling discharge. Dressing present.  EXTREMITIES: chronic 3+ edema NEUROLOGIC: nonfocal PSYCHIATRIC: The patient is alert and oriented x 3.  SKIN: as below  DATA REVIEW:   CBC  Recent Labs  Lab 07/27/21 0904  WBC 9.5  HGB 9.8*  HCT 34.1*  PLT 242     Chemistries  Recent Labs  Lab 07/24/21 0320 07/26/21 0443 07/27/21 0904  NA 135   < > 135  K 3.9   < > 3.4*  CL 95*   < > 95*  CO2 28   < > 28  GLUCOSE 140*   < > 132*  BUN 31*   < > 13  CREATININE 4.99*   < > 3.06*  CALCIUM 7.8*   < > 8.3*  MG 2.3  --   --    < > = values in this interval not displayed.     Microbiology Results   Recent Results (from the past 240 hour(s))  Resp Panel by RT-PCR (Flu A&B, Covid) Nasopharyngeal Swab     Status: None   Collection Time: 07/23/21  9:22 PM   Specimen: Nasopharyngeal Swab; Nasopharyngeal(NP) swabs in vial transport medium  Result Value Ref Range Status   SARS Coronavirus 2 by RT PCR NEGATIVE NEGATIVE Final    Comment: (NOTE) SARS-CoV-2 target nucleic acids are NOT DETECTED.  The SARS-CoV-2 RNA is generally detectable in upper respiratory specimens during the acute phase of infection. The lowest concentration of SARS-CoV-2 viral copies this assay can detect is 138 copies/mL. A negative result does not preclude SARS-Cov-2 infection and should not be used as the sole basis for treatment or other patient management decisions. A negative result may occur with  improper specimen collection/handling, submission of specimen other than  nasopharyngeal swab, presence of viral mutation(s) within the areas targeted by this assay, and inadequate number of viral copies(<138 copies/mL). A negative result must be combined with clinical observations, patient history, and epidemiological information. The expected result is Negative.  Fact Sheet for Patients:  EntrepreneurPulse.com.au  Fact Sheet for Healthcare Providers:  IncredibleEmployment.be  This test is no t yet approved or cleared  by the [place] and  has been authorized for detection and/or diagnosis of SARS-CoV-2 by FDA under an Emergency Use Authorization (EUA). This EUA will remain  in effect (meaning this test can be used) for the duration of the COVID-19 declaration under Section 564(b)(1) of the Act, 21 U.S.C.section 360bbb-3(b)(1), unless the authorization is terminated  or revoked sooner.       Influenza A by PCR NEGATIVE NEGATIVE Final   Influenza B by PCR NEGATIVE NEGATIVE Final    Comment: (NOTE) The Xpert Xpress SARS-CoV-2/FLU/RSV plus assay is intended as an aid in the diagnosis of influenza from Nasopharyngeal swab specimens and should not be used as a sole basis for treatment. Nasal washings and aspirates are unacceptable for Xpert Xpress SARS-CoV-2/FLU/RSV testing.  Fact Sheet for Patients: EntrepreneurPulse.com.au  Fact Sheet for Healthcare Providers: IncredibleEmployment.be  This test is not yet approved or cleared by the [place] FDA and has been authorized for detection and/or diagnosis of SARS-CoV-2 by FDA under an Emergency Use Authorization (EUA). This EUA will remain in effect (meaning this test can be used) for the duration of the COVID-19 declaration under Section 564(b)(1) of the Act, 21 U.S.C. section 360bbb-3(b)(1), unless the authorization is terminated or revoked.  Performed at [facility], [address] [place], [place]  [address]   MRSA Next Gen by PCR, Nasal     Status: None   Collection Time: 07/27/21 12:31 PM   Specimen: Nasal Mucosa; Nasal Swab  Result Value Ref Range Status   MRSA by PCR Next Gen NOT DETECTED NOT DETECTED Final    Comment: (NOTE) The GeneXpert MRSA Assay (FDA approved for NASAL specimens only), is one component of a comprehensive MRSA colonization surveillance program. It is not intended to diagnose MRSA infection nor to guide or monitor treatment for MRSA infections. Test performance is not FDA approved in patients less than 18 years old. Performed at [facility], [address] [place], [place] [address]   Resp Panel by RT-PCR (Flu A&B, Covid) Nasopharyngeal Swab     Status: None   Collection Time: 07/28/21 12:20 PM   Specimen: Nasopharyngeal Swab; Nasopharyngeal(NP) swabs in vial transport medium  Result Value Ref Range Status   SARS Coronavirus 2 by RT PCR NEGATIVE NEGATIVE Final    Comment: (NOTE) SARS-CoV-2 target nucleic acids are NOT DETECTED.  The SARS-CoV-2 RNA is generally detectable in upper respiratory specimens during the acute phase of infection. The lowest concentration of SARS-CoV-2 viral copies this assay can detect is 138 copies/mL. A negative result does not preclude SARS-Cov-2 infection and should not be used as the sole basis for treatment or other patient management decisions. A negative result may occur with  improper specimen collection/handling, submission of specimen other than nasopharyngeal swab, presence of viral mutation(s) within the areas targeted by this assay, and inadequate number of viral copies(<138 copies/mL). A negative result must be combined with clinical observations, patient history, and epidemiological information. The expected result is Negative.  Fact Sheet for Patients:  EntrepreneurPulse.com.au  Fact Sheet for Healthcare Providers:  IncredibleEmployment.be  This test is no t  yet approved or cleared by the [place] FDA and  has been authorized for detection and/or diagnosis of SARS-CoV-2 by FDA under an Emergency Use Authorization (EUA). This EUA will remain  in effect (meaning this test can be used) for the duration of the COVID-19 declaration under Section 564(b)(1) of the Act, 21 U.S.C.section 360bbb-3(b)(1), unless the authorization is terminated  or revoked sooner.  Influenza A by PCR NEGATIVE NEGATIVE Final   Influenza B by PCR NEGATIVE NEGATIVE Final    Comment: (NOTE) The Xpert Xpress SARS-CoV-2/FLU/RSV plus assay is intended as an aid in the diagnosis of influenza from Nasopharyngeal swab specimens and should not be used as a sole basis for treatment. Nasal washings and aspirates are unacceptable for Xpert Xpress SARS-CoV-2/FLU/RSV testing.  Fact Sheet for Patients: EntrepreneurPulse.com.au  Fact Sheet for Healthcare Providers: IncredibleEmployment.be  This test is not yet approved or cleared by the [place] FDA and has been authorized for detection and/or diagnosis of SARS-CoV-2 by FDA under an Emergency Use Authorization (EUA). This EUA will remain in effect (meaning this test can be used) for the duration of the COVID-19 declaration under Section 564(b)(1) of the Act, 21 U.S.C. section 360bbb-3(b)(1), unless the authorization is terminated or revoked.  Performed at [facility], [address] [place], [place] [address]     RADIOLOGY:  US ARTERIAL ABI (SCREENING LOWER EXTREMITY)  Result Date: 07/28/2021 CLINICAL DATA:  70 year old female with PA D EXAM: NONINVASIVE PHYSIOLOGIC VASCULAR STUDY OF BILATERAL LOWER EXTREMITIES TECHNIQUE: Evaluation of both lower extremities was performed at rest, including calculation of ankle-brachial indices, multiple segmental pressure evaluation, segmental Doppler and segmental pulse volume recording. COMPARISON:  None. FINDINGS: Right ABI:   0.52 Left ABI:  0.54 Right Lower Extremity: Segmental doppler at the right ankle demonstrates monophasic waveforms Left Lower Extremity: Segmental Doppler at the left ankle demonstrates monophasic waveforms IMPRESSION: Resting ABI of the bilateral lower extremity in the moderate range arterial occlusive disease. Segmental exam at the ankles demonstrates more proximal arterial occlusive disease, with monophasic waveforms throughout. Signed, Dulcy Fanny. Dellia Nims, RPVI [facility] Electronically Signed   By: Corrie Mckusick D.O.   On: 07/28/2021 09:47     CODE STATUS:     Code Status Orders  (From admission, onward)           Start     Ordered   07/23/21 2134  Full code  Continuous        07/23/21 2134           Code Status History     This patient has a current code status but no historical code status.        TOTAL TIME TAKING CARE OF THIS PATIENT: 35 minutes.    Fritzi Mandes M.D  [facility]    CC: Primary care physician; Pcp, No

## 2021-07-29 NOTE — Progress Notes (Signed)
Patient discharged to [facility] via EMS with all belongings.  VSS.  PIV removed, no bleeding, intact.

## 2021-07-29 NOTE — TOC Transition Note (Signed)
Transition of Care [facility]) - CM/SW Discharge Note   Patient Details  Name: Linda Quinn MRN: [id number] Date of Birth: 01-11-1951  [facility] CM/SW Contact:  Beverly Sessions, RN Phone Number: 07/29/2021, 1:57 PM   Clinical Narrative:    Patient to discharge today  DC info sent in hub EMS packet on chart EMS transport called  Bedside RN to call report Patient declines for anyone to be notified of discharge  Elvera Bicker dialysis liaison notified of discharge    Final next level of care: [place]     Patient Goals and CMS Choice        Discharge Placement              Patient chooses bed at: [facility]                                     Social Determinants of Health (SDOH) Interventions     Readmission Risk Interventions No flowsheet data found.

## 2021-07-29 NOTE — Progress Notes (Addendum)
Dr. Prudy Feeler made aware via secure chat and amion that patient had a blood pressure of 86/60. Patient was asymptomatic during this time. Vitals were rechecked and had improved. No new orders. Nursing will continue to monitor.

## 2021-07-29 NOTE — Plan of Care (Signed)

## 2021-07-29 NOTE — Progress Notes (Signed)
Report called to [facility] and given to Mallie Darting, RN via phone.  All questions answered.  Awaiting EMS to pick up patient for discharge.

## 2021-07-29 NOTE — Plan of Care (Signed)
PMT note:  Returned to bedside to attempt [place] as patient did not D/C yesterday. Patient is in dialysis at this time and unable. Recommend outpatient palliative.

## 2021-07-29 NOTE — Plan of Care (Signed)
  Problem: Education: Goal: Knowledge of General Education information will improve Description: Including pain rating scale, medication(s)/side effects and non-pharmacologic comfort measures 07/29/2021 1414 by Orvan Seen, RN Outcome: Completed/Met 07/29/2021 1414 by Orvan Seen, RN Outcome: Progressing   Problem: Health Behavior/Discharge Planning: Goal: Ability to manage health-related needs will improve 07/29/2021 1414 by Orvan Seen, RN Outcome: Completed/Met 07/29/2021 1414 by Orvan Seen, RN Outcome: Progressing   Problem: Clinical Measurements: Goal: Ability to maintain clinical measurements within normal limits will improve 07/29/2021 1414 by Orvan Seen, RN Outcome: Completed/Met 07/29/2021 1414 by Orvan Seen, RN Outcome: Progressing Goal: Will remain free from infection 07/29/2021 1414 by Orvan Seen, RN Outcome: Completed/Met 07/29/2021 1414 by Orvan Seen, RN Outcome: Progressing Goal: Diagnostic test results will improve 07/29/2021 1414 by Orvan Seen, RN Outcome: Completed/Met 07/29/2021 1414 by Orvan Seen, RN Outcome: Progressing Goal: Respiratory complications will improve 07/29/2021 1414 by Orvan Seen, RN Outcome: Completed/Met 07/29/2021 1414 by Orvan Seen, RN Outcome: Progressing Goal: Cardiovascular complication will be avoided 07/29/2021 1414 by Orvan Seen, RN Outcome: Completed/Met 07/29/2021 1414 by Orvan Seen, RN Outcome: Progressing   Problem: Activity: Goal: Risk for activity intolerance will decrease 07/29/2021 1414 by Orvan Seen, RN Outcome: Completed/Met 07/29/2021 1414 by Orvan Seen, RN Outcome: Progressing   Problem: Nutrition: Goal: Adequate nutrition will be maintained 07/29/2021 1414 by Orvan Seen, RN Outcome: Completed/Met 07/29/2021 1414 by Orvan Seen, RN Outcome: Progressing   Problem: Coping: Goal: Level of anxiety will decrease 07/29/2021 1414 by Orvan Seen, RN Outcome: Completed/Met 07/29/2021 1414 by Orvan Seen, RN Outcome: Progressing   Problem: Elimination: Goal: Will not experience complications related to bowel motility 07/29/2021 1414 by Orvan Seen, RN Outcome: Completed/Met 07/29/2021 1414 by Orvan Seen, RN Outcome: Progressing Goal: Will not experience complications related to urinary retention 07/29/2021 1414 by Orvan Seen, RN Outcome: Completed/Met 07/29/2021 1414 by Orvan Seen, RN Outcome: Progressing   Problem: Pain Managment: Goal: General experience of comfort will improve 07/29/2021 1414 by Orvan Seen, RN Outcome: Completed/Met 07/29/2021 1414 by Orvan Seen, RN Outcome: Progressing   Problem: Safety: Goal: Ability to remain free from injury will improve 07/29/2021 1414 by Orvan Seen, RN Outcome: Completed/Met 07/29/2021 1414 by Orvan Seen, RN Outcome: Progressing   Problem: Skin Integrity: Goal: Risk for impaired skin integrity will decrease 07/29/2021 1414 by Orvan Seen, RN Outcome: Completed/Met 07/29/2021 1414 by Orvan Seen, RN Outcome: Progressing   Problem: Clinical Measurements: Goal: Ability to avoid or minimize complications of infection will improve 07/29/2021 1414 by Orvan Seen, RN Outcome: Completed/Met 07/29/2021 1414 by Orvan Seen, RN Outcome: Progressing   Problem: Skin Integrity: Goal: Skin integrity will improve 07/29/2021 1414 by Orvan Seen, RN Outcome: Completed/Met 07/29/2021 1414 by Orvan Seen, RN Outcome: Progressing

## 2021-07-31 ENCOUNTER — Emergency Department: Payer: Medicare HMO

## 2021-07-31 ENCOUNTER — Inpatient Hospital Stay
Admission: EM | Admit: 2021-07-31 | Discharge: 2021-08-04 | DRG: 308 | Disposition: A | Discharge status: Other Acute Inpatient Hospital | Payer: Medicare HMO | Attending: Internal Medicine | Admitting: Internal Medicine

## 2021-07-31 DIAGNOSIS — N186 End stage renal disease: Secondary | ICD-10-CM | POA: Diagnosis present

## 2021-07-31 DIAGNOSIS — I959 Hypotension, unspecified: Secondary | ICD-10-CM | POA: Diagnosis present

## 2021-07-31 DIAGNOSIS — Z6841 Body Mass Index (BMI) 40.0 and over, adult: Secondary | ICD-10-CM

## 2021-07-31 DIAGNOSIS — Z7189 Other specified counseling: Secondary | ICD-10-CM | POA: Diagnosis not present

## 2021-07-31 DIAGNOSIS — I472 Ventricular tachycardia, unspecified: Secondary | ICD-10-CM

## 2021-07-31 DIAGNOSIS — L89522 Pressure ulcer of left ankle, stage 2: Secondary | ICD-10-CM | POA: Diagnosis present

## 2021-07-31 DIAGNOSIS — I480 Paroxysmal atrial fibrillation: Secondary | ICD-10-CM | POA: Diagnosis present

## 2021-07-31 DIAGNOSIS — Z4502 Encounter for adjustment and management of automatic implantable cardiac defibrillator: Secondary | ICD-10-CM

## 2021-07-31 DIAGNOSIS — R1011 Right upper quadrant pain: Secondary | ICD-10-CM

## 2021-07-31 DIAGNOSIS — Z87891 Personal history of nicotine dependence: Secondary | ICD-10-CM

## 2021-07-31 DIAGNOSIS — N2581 Secondary hyperparathyroidism of renal origin: Secondary | ICD-10-CM | POA: Diagnosis present

## 2021-07-31 DIAGNOSIS — Z794 Long term (current) use of insulin: Secondary | ICD-10-CM | POA: Diagnosis not present

## 2021-07-31 DIAGNOSIS — I953 Hypotension of hemodialysis: Secondary | ICD-10-CM | POA: Diagnosis present

## 2021-07-31 DIAGNOSIS — I4891 Unspecified atrial fibrillation: Secondary | ICD-10-CM

## 2021-07-31 DIAGNOSIS — L03311 Cellulitis of abdominal wall: Secondary | ICD-10-CM | POA: Diagnosis present

## 2021-07-31 DIAGNOSIS — I5023 Acute on chronic systolic (congestive) heart failure: Secondary | ICD-10-CM | POA: Diagnosis not present

## 2021-07-31 DIAGNOSIS — L89892 Pressure ulcer of other site, stage 2: Secondary | ICD-10-CM | POA: Diagnosis present

## 2021-07-31 DIAGNOSIS — L89302 Pressure ulcer of unspecified buttock, stage 2: Secondary | ICD-10-CM | POA: Diagnosis present

## 2021-07-31 DIAGNOSIS — Z882 Allergy status to sulfonamides status: Secondary | ICD-10-CM | POA: Diagnosis not present

## 2021-07-31 DIAGNOSIS — I429 Cardiomyopathy, unspecified: Secondary | ICD-10-CM | POA: Diagnosis present

## 2021-07-31 DIAGNOSIS — G4730 Sleep apnea, unspecified: Secondary | ICD-10-CM | POA: Diagnosis present

## 2021-07-31 DIAGNOSIS — I5043 Acute on chronic combined systolic (congestive) and diastolic (congestive) heart failure: Secondary | ICD-10-CM | POA: Diagnosis present

## 2021-07-31 DIAGNOSIS — J9601 Acute respiratory failure with hypoxia: Secondary | ICD-10-CM

## 2021-07-31 DIAGNOSIS — Z7982 Long term (current) use of aspirin: Secondary | ICD-10-CM

## 2021-07-31 DIAGNOSIS — R0603 Acute respiratory distress: Secondary | ICD-10-CM

## 2021-07-31 DIAGNOSIS — J810 Acute pulmonary edema: Secondary | ICD-10-CM

## 2021-07-31 DIAGNOSIS — Z881 Allergy status to other antibiotic agents status: Secondary | ICD-10-CM | POA: Diagnosis not present

## 2021-07-31 DIAGNOSIS — Z7401 Bed confinement status: Secondary | ICD-10-CM

## 2021-07-31 DIAGNOSIS — Z66 Do not resuscitate: Secondary | ICD-10-CM | POA: Diagnosis not present

## 2021-07-31 DIAGNOSIS — E1140 Type 2 diabetes mellitus with diabetic neuropathy, unspecified: Secondary | ICD-10-CM

## 2021-07-31 DIAGNOSIS — R109 Unspecified abdominal pain: Secondary | ICD-10-CM

## 2021-07-31 DIAGNOSIS — Z992 Dependence on renal dialysis: Secondary | ICD-10-CM | POA: Diagnosis not present

## 2021-07-31 DIAGNOSIS — E1142 Type 2 diabetes mellitus with diabetic polyneuropathy: Secondary | ICD-10-CM | POA: Diagnosis present

## 2021-07-31 DIAGNOSIS — D631 Anemia in chronic kidney disease: Secondary | ICD-10-CM | POA: Diagnosis present

## 2021-07-31 DIAGNOSIS — Z20822 Contact with and (suspected) exposure to covid-19: Secondary | ICD-10-CM | POA: Diagnosis present

## 2021-07-31 DIAGNOSIS — Z95810 Presence of automatic (implantable) cardiac defibrillator: Secondary | ICD-10-CM | POA: Diagnosis not present

## 2021-07-31 DIAGNOSIS — E1122 Type 2 diabetes mellitus with diabetic chronic kidney disease: Secondary | ICD-10-CM | POA: Diagnosis present

## 2021-07-31 DIAGNOSIS — Z7901 Long term (current) use of anticoagulants: Secondary | ICD-10-CM

## 2021-07-31 DIAGNOSIS — Z79899 Other long term (current) drug therapy: Secondary | ICD-10-CM

## 2021-07-31 DIAGNOSIS — Z86711 Personal history of pulmonary embolism: Secondary | ICD-10-CM

## 2021-07-31 DIAGNOSIS — K8020 Calculus of gallbladder without cholecystitis without obstruction: Secondary | ICD-10-CM | POA: Diagnosis present

## 2021-07-31 LAB — CBG MONITORING, ED
Glucose-Capillary: 162 mg/dL — ABNORMAL HIGH (ref 70–99)
Glucose-Capillary: 136 mg/dL — ABNORMAL HIGH (ref 70–99)
Glucose-Capillary: 115 mg/dL — ABNORMAL HIGH (ref 70–99)
Glucose-Capillary: 130 mg/dL — ABNORMAL HIGH (ref 70–99)
Glucose-Capillary: 162 mg/dL — ABNORMAL HIGH (ref 70–99)

## 2021-07-31 LAB — CBC WITH DIFFERENTIAL/PLATELET
Abs Immature Granulocytes: 0.04 10*3/uL (ref 0.00–0.07)
Basophils Absolute: 0.1 10*3/uL (ref 0.0–0.1)
Basophils Relative: 1 %
Eosinophils Absolute: 0.1 10*3/uL (ref 0.0–0.5)
Eosinophils Relative: 1 %
HCT: 35.7 % — ABNORMAL LOW (ref 36.0–46.0)
Hemoglobin: 10.4 g/dL — ABNORMAL LOW (ref 12.0–15.0)
Immature Granulocytes: 0 %
Lymphocytes Relative: 14 %
Lymphs Abs: 1.3 10*3/uL (ref 0.7–4.0)
MCH: 27.5 pg (ref 26.0–34.0)
MCHC: 29.1 g/dL — ABNORMAL LOW (ref 30.0–36.0)
MCV: 94.4 fL (ref 80.0–100.0)
Monocytes Absolute: 0.9 10*3/uL (ref 0.1–1.0)
Monocytes Relative: 10 %
Neutro Abs: 6.7 10*3/uL (ref 1.7–7.7)
Neutrophils Relative %: 74 %
Platelets: 298 10*3/uL (ref 150–400)
RBC: 3.78 MIL/uL — ABNORMAL LOW (ref 3.87–5.11)
RDW: 23.9 % — ABNORMAL HIGH (ref 11.5–15.5)
Smear Review: NORMAL
WBC: 9 10*3/uL (ref 4.0–10.5)
nRBC: 0.6 % — ABNORMAL HIGH (ref 0.0–0.2)

## 2021-07-31 LAB — COMPREHENSIVE METABOLIC PANEL
ALT: 10 U/L (ref 0–44)
AST: 16 U/L (ref 15–41)
Albumin: 2.6 g/dL — ABNORMAL LOW (ref 3.5–5.0)
Alkaline Phosphatase: 127 U/L — ABNORMAL HIGH (ref 38–126)
Anion gap: 15 (ref 5–15)
BUN: 26 mg/dL — ABNORMAL HIGH (ref 8–23)
CO2: 23 mmol/L (ref 22–32)
Calcium: 8.5 mg/dL — ABNORMAL LOW (ref 8.9–10.3)
Chloride: 92 mmol/L — ABNORMAL LOW (ref 98–111)
Creatinine, Ser: 4.67 mg/dL — ABNORMAL HIGH (ref 0.44–1.00)
GFR, Estimated: 10 mL/min — ABNORMAL LOW (ref >60)
Glucose, Bld: 159 mg/dL — ABNORMAL HIGH (ref 70–99)
Potassium: 4.1 mmol/L (ref 3.5–5.1)
Sodium: 130 mmol/L — ABNORMAL LOW (ref 135–145)
Total Bilirubin: 1.8 mg/dL — ABNORMAL HIGH (ref 0.3–1.2)
Total Protein: 7.1 g/dL (ref 6.5–8.1)

## 2021-07-31 LAB — BASIC METABOLIC PANEL
Anion gap: 14 (ref 5–15)
BUN: 27 mg/dL — ABNORMAL HIGH (ref 8–23)
CO2: 25 mmol/L (ref 22–32)
Calcium: 8.6 mg/dL — ABNORMAL LOW (ref 8.9–10.3)
Chloride: 93 mmol/L — ABNORMAL LOW (ref 98–111)
Creatinine, Ser: 4.68 mg/dL — ABNORMAL HIGH (ref 0.44–1.00)
GFR, Estimated: 10 mL/min — ABNORMAL LOW (ref >60)
Glucose, Bld: 156 mg/dL — ABNORMAL HIGH (ref 70–99)
Potassium: 4.1 mmol/L (ref 3.5–5.1)
Sodium: 132 mmol/L — ABNORMAL LOW (ref 135–145)

## 2021-07-31 LAB — CBC
HCT: 36.2 % (ref 36.0–46.0)
Hemoglobin: 10.3 g/dL — ABNORMAL LOW (ref 12.0–15.0)
MCH: 26.1 pg (ref 26.0–34.0)
MCHC: 28.5 g/dL — ABNORMAL LOW (ref 30.0–36.0)
MCV: 91.9 fL (ref 80.0–100.0)
Platelets: 254 10*3/uL (ref 150–400)
RBC: 3.94 MIL/uL (ref 3.87–5.11)
RDW: 23.7 % — ABNORMAL HIGH (ref 11.5–15.5)
WBC: 8.1 10*3/uL (ref 4.0–10.5)
nRBC: 0 % (ref 0.0–0.2)

## 2021-07-31 LAB — TSH: TSH: 10.679 u[IU]/mL — ABNORMAL HIGH (ref 0.350–4.500)

## 2021-07-31 LAB — RESP PANEL BY RT-PCR (FLU A&B, COVID) ARPGX2
Influenza A by PCR: NEGATIVE
Influenza B by PCR: NEGATIVE
SARS Coronavirus 2 by RT PCR: NEGATIVE

## 2021-07-31 LAB — PROTIME-INR
INR: 3 — ABNORMAL HIGH (ref 0.8–1.2)
Prothrombin Time: 30.9 seconds — ABNORMAL HIGH (ref 11.4–15.2)

## 2021-07-31 LAB — TROPONIN I (HIGH SENSITIVITY)
Troponin I (High Sensitivity): 36 ng/L — ABNORMAL HIGH (ref <18)
Troponin I (High Sensitivity): 41 ng/L — ABNORMAL HIGH (ref <18)

## 2021-07-31 LAB — MAGNESIUM: Magnesium: 2.4 mg/dL (ref 1.7–2.4)

## 2021-07-31 LAB — BRAIN NATRIURETIC PEPTIDE: B Natriuretic Peptide: >4500 pg/mL — ABNORMAL HIGH (ref 0.0–100.0)

## 2021-07-31 MED ORDER — ACETAMINOPHEN 650 MG RE SUPP: 650.0000 mg | Freq: Four times a day (QID) | RECTAL | Status: DC | PRN

## 2021-07-31 MED ORDER — MIDODRINE HCL 5 MG PO TABS
10.0000 mg | ORAL_TABLET | Freq: Three times a day (TID) | ORAL | Status: DC
  Administered 2021-07-31 (×2): 10 mg via ORAL
  Filled 2021-07-31 (×2): qty 2

## 2021-07-31 MED ORDER — INSULIN ASPART 100 UNIT/ML IJ SOLN: 0.0000 [IU] | Freq: Three times a day (TID) | INTRAMUSCULAR | Status: DC

## 2021-07-31 MED ORDER — WARFARIN SODIUM 2.5 MG PO TABS: 2.5000 mg | ORAL_TABLET | Freq: Every day | ORAL | Status: DC

## 2021-07-31 MED ORDER — CHLORHEXIDINE GLUCONATE CLOTH 2 % EX PADS
6.0000 | MEDICATED_PAD | Freq: Every day | CUTANEOUS | Status: DC
  Administered 2021-08-02: 6 via TOPICAL
  Filled 2021-07-31 (×3): qty 6

## 2021-07-31 MED ORDER — WARFARIN - PHARMACIST DOSING INPATIENT
Freq: Every day | Status: DC
  Filled 2021-07-31: qty 1

## 2021-07-31 MED ORDER — TRAZODONE HCL 50 MG PO TABS: 25.0000 mg | ORAL_TABLET | Freq: Every evening | ORAL | Status: DC | PRN

## 2021-07-31 MED ORDER — GABAPENTIN 100 MG PO CAPS
100.0000 mg | ORAL_CAPSULE | Freq: Two times a day (BID) | ORAL | Status: DC
  Administered 2021-07-31 – 2021-08-03 (×7): 100 mg via ORAL
  Filled 2021-07-31 (×7): qty 1

## 2021-07-31 MED ORDER — LIDOCAINE 5 % EX PTCH
1.0000 | MEDICATED_PATCH | CUTANEOUS | Status: DC
  Filled 2021-07-31: qty 1

## 2021-07-31 MED ORDER — SODIUM CHLORIDE 0.9 % IV SOLN: INTRAVENOUS | Status: DC

## 2021-07-31 MED ORDER — LIDOCAINE 5 % EX PTCH: 1.0000 | MEDICATED_PATCH | CUTANEOUS | Status: DC

## 2021-07-31 MED ORDER — ACETAMINOPHEN 325 MG PO TABS: 650.0000 mg | ORAL_TABLET | Freq: Four times a day (QID) | ORAL | Status: DC | PRN

## 2021-07-31 MED ORDER — CALCITRIOL 0.25 MCG PO CAPS
0.5000 ug | ORAL_CAPSULE | Freq: Every day | ORAL | Status: DC
  Administered 2021-07-31 – 2021-08-01 (×2): 0.5 ug via ORAL
  Filled 2021-07-31 (×2): qty 2

## 2021-07-31 MED ORDER — INSULIN GLARGINE-YFGN 100 UNIT/ML ~~LOC~~ SOLN
10.0000 [IU] | Freq: Every day | SUBCUTANEOUS | Status: DC
  Administered 2021-07-31: 10 [IU] via SUBCUTANEOUS
  Filled 2021-07-31 (×3): qty 0.1

## 2021-07-31 MED ORDER — LORATADINE 10 MG PO TABS
10.0000 mg | ORAL_TABLET | Freq: Every day | ORAL | Status: DC
  Administered 2021-07-31 – 2021-08-04 (×5): 10 mg via ORAL
  Filled 2021-07-31 (×5): qty 1

## 2021-07-31 MED ORDER — ALBUMIN HUMAN 25 % IV SOLN
25.0000 g | Freq: Once | INTRAVENOUS | Status: AC
  Administered 2021-07-31: 25 g via INTRAVENOUS
  Filled 2021-07-31: qty 100

## 2021-07-31 MED ORDER — CALCIUM CARBONATE ANTACID 500 MG PO CHEW
2.0000 | CHEWABLE_TABLET | Freq: Two times a day (BID) | ORAL | Status: DC
  Administered 2021-07-31 – 2021-08-04 (×9): 400 mg via ORAL
  Filled 2021-07-31 (×9): qty 2

## 2021-07-31 MED ORDER — WARFARIN SODIUM 1 MG PO TABS
1.5000 mg | ORAL_TABLET | Freq: Once | ORAL | Status: DC
  Filled 2021-07-31: qty 1

## 2021-07-31 MED ORDER — ONDANSETRON HCL 4 MG/2ML IJ SOLN: 4.0000 mg | Freq: Four times a day (QID) | INTRAMUSCULAR | Status: DC | PRN

## 2021-07-31 MED ORDER — AMIODARONE HCL IN DEXTROSE 360-4.14 MG/200ML-% IV SOLN
60.0000 mg/h | INTRAVENOUS | Status: DC
  Filled 2021-07-31: qty 200

## 2021-07-31 MED ORDER — SODIUM CHLORIDE 0.9 % IV BOLUS
250.0000 mL | Freq: Once | INTRAVENOUS | Status: AC
  Administered 2021-07-31: 250 mL via INTRAVENOUS

## 2021-07-31 MED ORDER — MELATONIN 5 MG PO TABS
2.5000 mg | ORAL_TABLET | Freq: Every day | ORAL | Status: DC
  Administered 2021-07-31 – 2021-08-03 (×4): 2.5 mg via ORAL
  Filled 2021-07-31 (×4): qty 1

## 2021-07-31 MED ORDER — ONDANSETRON HCL 4 MG/2ML IJ SOLN
INTRAMUSCULAR | Status: AC
  Administered 2021-07-31: 4 mg
  Filled 2021-07-31: qty 2

## 2021-07-31 MED ORDER — SENNOSIDES-DOCUSATE SODIUM 8.6-50 MG PO TABS
2.0000 | ORAL_TABLET | Freq: Every day | ORAL | Status: DC
  Administered 2021-07-31 – 2021-08-03 (×4): 2 via ORAL
  Filled 2021-07-31 (×4): qty 2

## 2021-07-31 MED ORDER — ASPIRIN EC 81 MG PO TBEC
81.0000 mg | DELAYED_RELEASE_TABLET | Freq: Every day | ORAL | Status: DC
  Administered 2021-07-31: 81 mg via ORAL
  Filled 2021-07-31: qty 1

## 2021-07-31 MED ORDER — MAGNESIUM SULFATE 2 GM/50ML IV SOLN
INTRAVENOUS | Status: AC
  Administered 2021-07-31: 2 g via INTRAVENOUS
  Filled 2021-07-31: qty 50

## 2021-07-31 MED ORDER — MAGNESIUM HYDROXIDE 400 MG/5ML PO SUSP: 30.0000 mL | Freq: Every day | ORAL | Status: DC | PRN

## 2021-07-31 MED ORDER — ASCORBIC ACID 500 MG PO TABS
500.0000 mg | ORAL_TABLET | Freq: Every day | ORAL | Status: DC
  Administered 2021-07-31 – 2021-08-01 (×2): 500 mg via ORAL
  Filled 2021-07-31 (×2): qty 1

## 2021-07-31 MED ORDER — AMIODARONE HCL IN DEXTROSE 360-4.14 MG/200ML-% IV SOLN
30.0000 mg/h | INTRAVENOUS | Status: DC
  Administered 2021-07-31 – 2021-08-04 (×7): 30 mg/h via INTRAVENOUS
  Filled 2021-07-31 (×8): qty 200

## 2021-07-31 MED ORDER — MAGNESIUM SULFATE 2 GM/50ML IV SOLN: 2.0000 g | Freq: Once | INTRAVENOUS | Status: AC

## 2021-07-31 MED ORDER — ONDANSETRON HCL 4 MG PO TABS: 4.0000 mg | ORAL_TABLET | Freq: Four times a day (QID) | ORAL | Status: DC | PRN

## 2021-07-31 MED ORDER — POLYETHYLENE GLYCOL 3350 17 G PO PACK
17.0000 g | PACK | Freq: Every day | ORAL | Status: DC
  Filled 2021-07-31: qty 1

## 2021-07-31 MED ORDER — AMIODARONE IV BOLUS ONLY 150 MG/100ML
150.0000 mg | Freq: Once | INTRAVENOUS | Status: AC
  Administered 2021-07-31: 150 mg via INTRAVENOUS
  Filled 2021-07-31: qty 100

## 2021-07-31 NOTE — Progress Notes (Signed)
[facility]  ROUNDING NOTE   Subjective:   Linda Quinn is a 70 y.o. female resident of a SNF with past medical conditions including CHF, Atrial fib, diabetes, sleep apnea and ESRD on dialysis. Patient was sent to ED with complaints of shortness of breath and lethargy. There were also reports of AICD firing before ED arrival. She will be admitted for AICD discharge [Z45.02]  Patient is known to this practice from previous admissions. She receives dialysis at the [facility] under the [facility] specialist. She was recently admitted earlier this month for abdominal cellulitis, fluid overload and AICD firing. She was discharged after dialysis on Tuesday. While in ED, she has had 2 runs of [facility] resulting in AICD firing. Believed to be fluid overloaded, we will dialyze her today. Amiodarone drip started   Objective:  Vital signs in last 24 hours:  Temp:  [97.6 F (36.4 C)] 97.6 F (36.4 C) (08/11 0335) Pulse Rate:  [67-70] 70 (08/11 0815) Resp:  [11-20] 13 (08/11 0815) BP: (91-120)/(40-96) 91/55 (08/11 0815) SpO2:  [95 %-100 %] 100 % (08/11 0815)  Weight change:  There were no vitals filed for this visit.   Intake/Output: No intake/output data recorded.   Intake/Output this shift:  No intake/output data recorded.  Physical Exam: General: NAD, laying in bed  Head: Normocephalic, atraumatic. Moist oral mucosal membranes  Eyes: Anicteric  Lungs:  Diminished, normal effort  Heart: Regular rate and rhythm  Abdomen:  Soft, nontender  Extremities:  1+ peripheral edema.  Neurologic: Nonfocal, moving all four extremities  Skin: No lesions  Access: Rt Permcath    Basic Metabolic Panel: Recent Labs  Lab 07/26/21 0443 07/27/21 0904 07/31/21 0130 07/31/21 0723  NA 135 135 130* 132*  K 3.3* 3.4* 4.1 4.1  CL 97* 95* 92* 93*  CO2 '28 28 23 25  '$ GLUCOSE 143* 132* 159* 156*  BUN 14 13 26* 27*  CREATININE 3.12* 3.06* 4.67* 4.68*  CALCIUM 8.2* 8.3* 8.5* 8.6*   MG  --   --  2.4  --      Liver Function Tests: Recent Labs  Lab 07/31/21 0130  AST 16  ALT 10  ALKPHOS 127*  BILITOT 1.8*  PROT 7.1  ALBUMIN 2.6*   No results for input(s): LIPASE, AMYLASE in the last 168 hours. No results for input(s): AMMONIA in the last 168 hours.  CBC: Recent Labs  Lab 07/26/21 0443 07/27/21 0904 07/31/21 0025 07/31/21 0723  WBC 9.0 9.5 9.0 8.1  NEUTROABS  --   --  6.7  --   HGB 9.6* 9.8* 10.4* 10.3*  HCT 33.6* 34.1* 35.7* 36.2  MCV 91.6 91.9 94.4 91.9  PLT 221 242 298 254     Cardiac Enzymes: No results for input(s): CKTOTAL, CKMB, CKMBINDEX, TROPONINI in the last 168 hours.  BNP: Invalid input(s): POCBNP  CBG: Recent Labs  Lab 07/28/21 2109 07/29/21 0755 07/29/21 1300 07/31/21 0013 07/31/21 0737  GLUCAP 159* 118* 112* 136* 162*     Microbiology: Results for orders placed or performed during the hospital encounter of 07/31/21  Resp Panel by RT-PCR (Flu A&B, Covid) Nasopharyngeal Swab     Status: None   Collection Time: 07/31/21 12:15 AM   Specimen: Nasopharyngeal Swab; Nasopharyngeal(NP) swabs in vial transport medium  Result Value Ref Range Status   SARS Coronavirus 2 by RT PCR NEGATIVE NEGATIVE Final    Comment: (NOTE) SARS-CoV-2 target nucleic acids are NOT DETECTED.  The SARS-CoV-2 RNA is generally detectable in  upper respiratory specimens during the acute phase of infection. The lowest concentration of SARS-CoV-2 viral copies this assay can detect is 138 copies/mL. A negative result does not preclude SARS-Cov-2 infection and should not be used as the sole basis for treatment or other patient management decisions. A negative result may occur with  improper specimen collection/handling, submission of specimen other than nasopharyngeal swab, presence of viral mutation(s) within the areas targeted by this assay, and inadequate number of viral copies(<138 copies/mL). A negative result must be combined with clinical  observations, patient history, and epidemiological information. The expected result is Negative.  Fact Sheet for Patients:  EntrepreneurPulse.com.au  Fact Sheet for Healthcare Providers:  IncredibleEmployment.be  This test is no t yet approved or cleared by the [place] FDA and  has been authorized for detection and/or diagnosis of SARS-CoV-2 by FDA under an Emergency Use Authorization (EUA). This EUA will remain  in effect (meaning this test can be used) for the duration of the COVID-19 declaration under Section 564(b)(1) of the Act, 21 U.S.C.section 360bbb-3(b)(1), unless the authorization is terminated  or revoked sooner.       Influenza A by PCR NEGATIVE NEGATIVE Final   Influenza B by PCR NEGATIVE NEGATIVE Final    Comment: (NOTE) The Xpert Xpress SARS-CoV-2/FLU/RSV plus assay is intended as an aid in the diagnosis of influenza from Nasopharyngeal swab specimens and should not be used as a sole basis for treatment. Nasal washings and aspirates are unacceptable for Xpert Xpress SARS-CoV-2/FLU/RSV testing.  Fact Sheet for Patients: EntrepreneurPulse.com.au  Fact Sheet for Healthcare Providers: IncredibleEmployment.be  This test is not yet approved or cleared by the [place] FDA and has been authorized for detection and/or diagnosis of SARS-CoV-2 by FDA under an Emergency Use Authorization (EUA). This EUA will remain in effect (meaning this test can be used) for the duration of the COVID-19 declaration under Section 564(b)(1) of the Act, 21 U.S.C. section 360bbb-3(b)(1), unless the authorization is terminated or revoked.  Performed at [facility], [address] [place], [place] [address]     Coagulation Studies: Recent Labs    07/31/21 0025  LABPROT 30.9*  INR 3.0*     Urinalysis: No results for input(s): COLORURINE, LABSPEC, PHURINE, GLUCOSEU, HGBUR, BILIRUBINUR,  KETONESUR, PROTEINUR, UROBILINOGEN, NITRITE, LEUKOCYTESUR in the last 72 hours.  Invalid input(s): APPERANCEUR    Imaging: DG Chest Port 1 View  Result Date: 07/31/2021 CLINICAL DATA:  Shortness of breath EXAM: PORTABLE CHEST 1 VIEW COMPARISON:  07/23/2021 FINDINGS: Right dialysis catheter and left pacer/ICD remain in place, unchanged. Cardiomegaly, vascular congestion. Possible mild interstitial edema. No effusions or acute bony abnormality. IMPRESSION: Cardiomegaly with vascular congestion, possible mild interstitial edema. Electronically Signed   By: Rolm Baptise M.D.   On: 07/31/2021 00:39     Medications:    sodium chloride Stopped (07/31/21 0756)   albumin human     amiodarone 30 mg/hr (07/31/21 0640)    vitamin C  500 mg Oral Daily   aspirin EC  81 mg Oral Daily   calcitRIOL  0.5 mcg Oral Daily   calcium carbonate  2 tablet Oral BID   Chlorhexidine Gluconate Cloth  6 each Topical Q0600   gabapentin  100 mg Oral BID   insulin aspart  0-9 Units Subcutaneous TID AC & HS   insulin glargine-yfgn  10 Units Subcutaneous Daily   lidocaine  1 patch Transdermal Q24H   loratadine  10 mg Oral Daily   melatonin  3 mg Oral QHS  midodrine  10 mg Oral TID WC   polyethylene glycol  17 g Oral Daily   senna-docusate  2 tablet Oral QHS   warfarin  2.5 mg Oral Daily   acetaminophen **OR** acetaminophen, magnesium hydroxide, ondansetron **OR** ondansetron (ZOFRAN) IV, traZODone  Assessment/ Plan:  Ms. Ziona Cicero is a 70 y.o.  female  with diabetes, Atrial Fib on Coumadin, anemia, pacemaker/AICD, and ESRD on HD, who was admitted to [facility] on 07/23/2021 for AICD discharge [Z45.02]   [facility], [facility] [address]/TTS/Rt Permcath  End stage renal disease on dialysis Will maintain the outpatient schedule, if possible Received dialysis today, UF1.9L removed. Had 2 runs of Vtach during last 20 - 30 minutes of dialysis treatment with AICD firing and LOC. Rapid response called each due to LOC. Patient  responsive and talking when RRT arrived. Treatment completed and pt sent back to ED with RRT nurse escort. Primary and Cardiologist notified of event.  Primary team suggesting Palliative care consult. Next treatment scheduled for Saturday.  2. Anemia of chronic kidney disease Lab Results  Component Value Date   HGB 10.3 (L) 07/31/2021   Hgb at acceptable range Will monitor labs  3. Secondary Hyperparathyroidism: Lab Results  Component Value Date   CALCIUM 8.6 (L) 07/31/2021   Calcium not at target Calcium carbonate BID    LOS: 0 Laisa Larrick 8/11/202210:29 AM

## 2021-07-31 NOTE — ED Notes (Signed)
Pt refuses meal tray

## 2021-07-31 NOTE — ED Notes (Signed)
Per MD Patsey Berthold, OK to tape magnet to pt's chest to temporarily deactivate AICD.

## 2021-07-31 NOTE — ED Notes (Signed)
Call to [employer] at [phone] to request a field rep to come switch ICD device to "off" per orders from palliative consult/transition to comfort care and patient-signed MOST form.

## 2021-07-31 NOTE — ED Notes (Signed)
Patient with run of Vtach, patients AICD fired patient is awake and alert, rhythm goes back to sinus tach, provider made aware.

## 2021-07-31 NOTE — Significant Event (Signed)
Rapid Response Event Note   Reason for Call :  VT during dialysis  Initial Focused Assessment:  Rapid response RN arrived in dialysis bay with patient surrounded by dialysis staff. Per dialysis staff, patient went into VT during end of dialysis treatment but her AICD fired after initial rapid response page. Patient at time of rapid response RN arrival patient alert with no complaints (states " I feel good"). Does not remember any symptoms while in VT or AICD firing, states she was "taking a good nap" during event. No chest pain post AICD firing. On amiodarone infusion with HR in 70s and SBP in 100s on dialysis monitor. Electrolytes on morning labs show K of 4.1 and magnesium of 2.4.   Interventions:  None.  Plan of Care:  Patient to finish end of treatment and disconnection and then will return to ED where she is holding awaiting a stepdown bed.  Event Summary:   MD Notified: Colon Flattery NP Call Time: 14:21 Arrival Time: 14:24 End Time: 14:27  Stephanie Acre, RN

## 2021-07-31 NOTE — Significant Event (Signed)
Rapid Response Event Note   Reason for Call :  VT again, see previous note  Initial Focused Assessment:  Patient had another asymptomatic VT event. Patient still no complaints of pain or distress or anything else. Vitals with BP 109/73 MAP 83, HR 70paced, RR 16 unlabored, oxygen saturations 100% on 4L [place]. Treatment already complete, patient waiting on transport back to ED room.   Interventions:  This RN escorted patient to ED room 1. See vital sign flowsheets for set of vital signs when back in ED 1. Gave update of events to [employer], Margreta Journey RN, and Presenter, broadcasting in ED.  Plan of Care:  Colon Flattery NP to notify cardiology about events.  Event Summary:   MD Notified: Colon Flattery NP Call Time: 14:46 Arrival Time: 14:48 End Time: 15:08  Stephanie Acre, RN

## 2021-07-31 NOTE — ED Notes (Signed)
RN received return call from [employer] to inform that local rep Quita Skye is currently in a procedure in [place] and will not be available to come deactivate pt's AICD until tomorrow. RN advised a call back in the morning to confirm pt's location in the facility since she has been flagged for admission. [employer] rep Quita Skye can be reached directly at [phone].   Meanwhile, hospital staff can tape a magnet to pt's chest over AICD which will temporarily deactivate it and avoid any unwanted device firing. RN will notify charge nurse.

## 2021-07-31 NOTE — Consult Note (Signed)
Consultation Note Date: 07/31/2021   Patient Name: Linda Quinn  DOB: 1951/03/06  MRN: [id number]  Age / Sex: 70 y.o., female  PCP: Townsend Roger, MD Referring Physician: Fritzi Mandes, MD  Reason for Consultation: Establishing goals of care  HPI/Patient Profile: Linda Quinn is a 70 y.o. African-American female with medical history significant for CHF, type 2 diabetes mellitus, paroxysmal atrial fibrillation, end-stage renal disease on hemodialysis and obstructive sleep apnea, as well as paroxysmal atrial fibrillation on Coumadin and history of ventricular tachycardia status post AICD, who was recently admitted here from 8/3 till 8/9 for abdominal wall cellulitis with ulceration of his abdominal  pannus and AICD firing as well as fluid overload, who presents emergency room with acute onset of shortness of breath and lethargy as well as an episode of AICD firing prior to arrival in the hospital.  While she was in the ER she had 2 episodes of ventricular tachycardia associated with AICD firing.  Clinical Assessment and Goals of Care: Patient is resting in bed. She states the friend listed, Mariea Clonts, is actually her husband. She states everyone else in the family has died.   We discussed her diagnoses, poor prognosis, GOC, EOL wishes disposition and options.  Created space and opportunity for patient  to explore thoughts and feelings regarding current medical information.   A detailed discussion was had today regarding advanced directives.  Concepts specific to code status, artifical feeding and hydration, IV antibiotics and rehospitalization were discussed.  The difference between an aggressive medical intervention path and a comfort care path was discussed.  Values and goals of care important to patient and family were attempted to be elicited.  Discussed limitations of medical interventions to prolong quality  of life in some situations and discussed the concept of human mortality.  She states her QOL is not acceptable and she understands her heart failure. She states honestly the ICD hurts when it fires. She tells me she is considering transitioning to comfort care and going to hospice. She would like DNR/DNI status at this time. She would like her ICD turned off now. She is requesting a natural and peaceful death. She would like to continue medications and to talk again in the morning about discharging to hospice facility, but wants to think on it overnight to be sure. She states she cannot go home with hospice. Primary RN present for conversation. Spoke with Dr. Posey Pronto in ED.   I completed a MOST form today and the signed original was placed in the chart. A photocopy was placed in the chart to be scanned into EMR. The patient outlined their wishes for the following treatment decisions:  Cardiopulmonary Resuscitation: Do Not Attempt Resuscitation (DNR/No CPR)  Medical Interventions: Limited Additional Interventions: Use medical treatment, IV fluids and cardiac monitoring as indicated, DO NOT USE intubation or mechanical ventilation. May consider use of less invasive airway support such as BiPAP or CPAP. Also provide comfort measures. Transfer to the hospital if indicated. Avoid intensive care.   Antibiotics: Antibiotics if indicated  IV Fluids: IV fluids for a defined trial period  Feeding Tube: No feeding tube        SUMMARY OF RECOMMENDATIONS   DNR/DNI Would like ICD deactivated Would like to discuss hospice tomorrow.    Prognosis:  < 2 weeks       Primary Diagnoses: Present on Admission: **None**   I have reviewed the medical record, interviewed the patient and family, and examined the patient. The following aspects are pertinent.  Past Medical History:  Diagnosis Date   CHF (congestive heart failure) (HCC)    Diabetes mellitus without complication (HCC)    Paroxysmal atrial  fibrillation (HCC)    Renal disorder    Respiratory failure (HCC)    Sleep apnea    Social History   Socioeconomic History   Marital status: Single    Spouse name: Not on file   Number of children: Not on file   Years of education: Not on file   Highest education level: Not on file  Occupational History   Not on file  Tobacco Use   Smoking status: Former    Types: Cigarettes   Smokeless tobacco: Never  Substance and Sexual Activity   Alcohol use: Not Currently   Drug use: Not Currently   Sexual activity: Not Currently  Other Topics Concern   Not on file  Social History Narrative   Not on file   Social Determinants of Health   Financial Resource Strain: Not on file  Food Insecurity: Not on file  Transportation Needs: Not on file  Physical Activity: Not on file  Stress: Not on file  Social Connections: Not on file   Family History  Problem Relation Age of Onset   Hypertension Mother    Scheduled Meds:  vitamin C  500 mg Oral Daily   aspirin EC  81 mg Oral Daily   calcitRIOL  0.5 mcg Oral Daily   calcium carbonate  2 tablet Oral BID   Chlorhexidine Gluconate Cloth  6 each Topical Q0600   gabapentin  100 mg Oral BID   insulin aspart  0-9 Units Subcutaneous TID AC & HS   insulin glargine-yfgn  10 Units Subcutaneous Daily   lidocaine  1 patch Transdermal Q24H   loratadine  10 mg Oral Daily   melatonin  2.5 mg Oral QHS   midodrine  10 mg Oral TID WC   polyethylene glycol  17 g Oral Daily   senna-docusate  2 tablet Oral QHS   warfarin  1.5 mg Oral ONCE-1600   warfarin  2.5 mg Oral Daily   Warfarin - Pharmacist Dosing Inpatient   Does not apply q1600   Continuous Infusions:  sodium chloride Stopped (07/31/21 0756)   amiodarone 30 mg/hr (07/31/21 0640)   PRN Meds:.acetaminophen **OR** acetaminophen, magnesium hydroxide, ondansetron **OR** ondansetron (ZOFRAN) IV, traZODone Medications Prior to Admission:  Prior to Admission medications   Medication Sig Start  Date End Date Taking? Authorizing Provider  aspirin EC 81 MG tablet Take 81 mg by mouth daily. Swallow whole.   Yes [provider]  calcitRIOL (ROCALTROL) 0.5 MCG capsule Take 0.5 mcg by mouth daily.   Yes [provider]  calcium carbonate (TUMS - DOSED IN MG ELEMENTAL CALCIUM) 500 MG chewable tablet Chew 2 tablets (400 mg of elemental calcium total) by mouth 2 (two) times daily. 07/28/21  Yes Fritzi Mandes, MD  doxycycline (VIBRA-TABS) 100 MG tablet Take 1 tablet (100 mg total) by mouth every 12 (twelve) hours for 5 days. 07/28/21  08/02/21 Yes Fritzi Mandes, MD  gabapentin (NEURONTIN) 100 MG capsule Take 100 mg by mouth 2 (two) times daily.   Yes [provider]  insulin glargine (LANTUS) 100 UNIT/ML injection Inject 10 Units into the skin daily.   Yes [provider]  insulin lispro (HUMALOG) 100 UNIT/ML KwikPen Inject 0-10 Units into the skin 3 (three) times daily.   Yes [provider]  lidocaine (LIDODERM) 5 % Place 1 patch onto the skin daily. Remove & Discard patch within 12 hours or as directed by MD   Yes [provider]  loratadine (CLARITIN) 10 MG tablet Take 10 mg by mouth daily.   Yes [provider]  melatonin 3 MG TABS tablet Take 3 mg by mouth at bedtime.   Yes [provider]  midodrine (PROAMATINE) 10 MG tablet Take 10 mg by mouth 3 (three) times daily.   Yes [provider]  polyethylene glycol (MIRALAX / GLYCOLAX) 17 g packet Take 17 g by mouth daily.   Yes [provider]  senna-docusate (SENOKOT-S) 8.6-50 MG tablet Take 2 tablets by mouth at bedtime.   Yes [provider]  vitamin C (ASCORBIC ACID) 500 MG tablet Take 500 mg by mouth daily.   Yes [provider]  warfarin (COUMADIN) 2.5 MG tablet Take 2.5 mg by mouth daily.   Yes [provider]   Allergies  Allergen Reactions   Ciprofloxacin Other (See Comments)   Duricef [Cefadroxil] Hives    Sulfamethoxazole-Trimethoprim Other (See Comments)   Review of Systems  Constitutional:  Positive for activity change.  Respiratory:  Positive for shortness of breath.    Physical Exam Pulmonary:     Effort: Pulmonary effort is normal.  Neurological:     Mental Status: She is alert.    Vital Signs: BP 105/61 (BP Location: Left Arm)   Pulse 69   Temp 97.6 F (36.4 C) (Oral)   Resp 15   SpO2 100%  Pain Scale: 0-10 POSS *See Group Information*: 1-Acceptable,Awake and alert Pain Score: 0-No pain   SpO2: SpO2: 100 % O2 Device:SpO2: 100 % O2 Flow Rate: .O2 Flow Rate (L/min): 4 L/min  IO: Intake/output summary:  Intake/Output Summary (Last 24 hours) at 07/31/2021 1555 Last data filed at 07/31/2021 1522 Gross per 24 hour  Intake 60 ml  Output 1931 ml  Net -1871 ml    LBM:   Baseline Weight:   Most recent weight:       Palliative Assessment/Data:     Time In: 3:30 Time Out: 4:00 Time Total: 30 min Greater than 50%  of this time was spent counseling and coordinating care related to the above assessment and plan.  Signed by: Asencion Gowda, NP   Please contact Palliative Medicine Team phone at [phone] for questions and concerns.  For individual provider: See Shea Evans

## 2021-07-31 NOTE — ED Notes (Signed)
Patient with second episode of Vtach, AICD fired patient awake and alert, rhythm returns to sinus.

## 2021-07-31 NOTE — ED Notes (Signed)
Provider made aware of BP 94/54 no new orders noted.

## 2021-07-31 NOTE — Progress Notes (Signed)
Patient started treatment as ordered. Patient given prescribed midodrine 10 mg and Albumin via catheter for blood pressure support. Patient remain asymptomatic and denies chest pain. Towards the end of treatment patient went into VT and was unresponsive for 1-2 seconds, rapid responds was called. Patient responsive and interacting with staff, dressing change to catheter finished and patient went into VT-[place] and became unresponsive with eyes rolled back of head for about 3-4 seconds. Rapid response was called. Patient nurse Clearence Ped given report and patient transported back to ER accompanied by rapid response nurse Adalberto Ill RN. 1.9 Liter removed during dialysis treatment today.

## 2021-07-31 NOTE — ED Provider Notes (Signed)
[facility] Emergency Department Provider Note   ____________________________________________   Event Date/Time   First MD Initiated Contact with Patient 07/31/21 0012     (approximate)  I have reviewed the triage vital signs and the nursing notes.   HISTORY  Chief Complaint Shortness of breath    HPI Linda Quinn is a 70 y.o. female brought to the ED via EMS from SNF with a chief complaint of shortness of breath.  Patient with a history of ESRD on HD M/W/F, CHF with EF 20%, PAF on warfarin, history of V. tach status post AICD who was recently hospitalized 8/3-8/9 for primarily abdominal wall cellulitis, CHF with anasarca, transient episode of V. tach status post AICD firing.  SNF called EMS for patient short of breath with hypoxia.  Upon EMS arrival, patient had brief period of V. tach with firing of her pacer.  Code STEMI was initiated prior to arrival.  Patient denies chest pain, abdominal pain, nausea, vomiting or diarrhea.     Past Medical History:  Diagnosis Date   CHF (congestive heart failure) (HCC)    Diabetes mellitus without complication (HCC)    Paroxysmal atrial fibrillation (HCC)    Renal disorder    Respiratory failure ([place])    Sleep apnea     Patient Active Problem List   Diagnosis Date Noted   AICD discharge 07/31/2021   Acute on chronic systolic CHF (congestive heart failure) (HCC)    Hypotension    Pressure injury of skin 07/24/2021   Abdominal wall cellulitis 07/24/2021   Ventricular tachycardia (HCC)    Pulmonary vascular congestion    ESRD (end stage renal disease) (HCC)    Obesity, Class III, BMI 40-49.9 (morbid obesity) ([place]) 07/23/2021   Pacemaker malfunction, initial encounter 07/23/2021   Cellulitis of abdominal wall 07/23/2021   AF (paroxysmal atrial fibrillation) ([place]) 07/23/2021   Anemia in chronic kidney disease 07/02/2021   Presence of cardiac pacemaker 07/02/2021   Type 2 diabetes mellitus with diabetic  neuropathy, without long-term current use of insulin ([place]) 07/02/2021   Unspecified systolic (congestive) heart failure ([place]) 07/02/2021    No past surgical history on file.  Prior to Admission medications   Medication Sig Start Date End Date Taking? Authorizing Provider  aspirin EC 81 MG tablet Take 81 mg by mouth daily. Swallow whole.    [provider]  calcitRIOL (ROCALTROL) 0.5 MCG capsule Take 0.5 mcg by mouth daily.    [provider]  calcium carbonate (TUMS - DOSED IN MG ELEMENTAL CALCIUM) 500 MG chewable tablet Chew 2 tablets (400 mg of elemental calcium total) by mouth 2 (two) times daily. 07/28/21   Fritzi Mandes, MD  doxycycline (VIBRA-TABS) 100 MG tablet Take 1 tablet (100 mg total) by mouth every 12 (twelve) hours for 5 days. 07/28/21 08/02/21  Fritzi Mandes, MD  gabapentin (NEURONTIN) 100 MG capsule Take 100 mg by mouth 2 (two) times daily.    [provider]  insulin glargine (LANTUS) 100 UNIT/ML injection Inject 10 Units into the skin daily.    [provider]  insulin lispro (HUMALOG KWIKPEN) 100 UNIT/ML KwikPen Inject 0-10 Units into the skin 3 (three) times daily.    [provider]  lidocaine (LIDODERM) 5 % Place 1 patch onto the skin daily. Remove & Discard patch within 12 hours or as directed by MD    [provider]  loratadine (CLARITIN) 10 MG tablet Take 10 mg by mouth daily.    [provider]  melatonin 3 MG TABS tablet Take 3 mg by mouth at bedtime.    [provider]  midodrine (PROAMATINE) 10 MG tablet Take 10 mg by mouth 3 (three) times daily.    [provider]  polyethylene glycol (MIRALAX / GLYCOLAX) 17 g packet Take 17 g by mouth daily.    [provider]  senna-docusate (SENOKOT-S) 8.6-50 MG tablet Take 2 tablets by mouth at bedtime.    [provider]  vitamin C (ASCORBIC ACID) 500 MG tablet Take 500 mg by mouth daily.    [provider]  warfarin (COUMADIN)  2.5 MG tablet Take 2.5 mg by mouth daily.    [provider]    Allergies Ciprofloxacin, Duricef [cefadroxil], and Sulfamethoxazole-trimethoprim  Family History  Problem Relation Age of Onset   Hypertension Mother     Social History Social History   Tobacco Use   Smoking status: Former    Types: Cigarettes   Smokeless tobacco: Never  Substance Use Topics   Alcohol use: Not Currently   Drug use: Not Currently    Review of Systems  Constitutional: No fever/chills Eyes: No visual changes. ENT: No sore throat. Cardiovascular: Denies chest pain. Respiratory: Positive for shortness of breath. Gastrointestinal: No abdominal pain.  No nausea, no vomiting.  No diarrhea.  No constipation. Genitourinary: Negative for dysuria. Musculoskeletal: Negative for back pain. Skin: Negative for rash. Neurological: Negative for headaches, focal weakness or numbness.   ____________________________________________   PHYSICAL EXAM:  VITAL SIGNS: ED Triage Vitals  Enc Vitals Group     BP      Pulse      Resp      Temp      Temp src      SpO2      Weight      Height      Head Circumference      Peak Flow      Pain Score      Pain Loc      Pain Edu?      Excl. in [place]?     Constitutional: Alert and oriented.  Chronically ill appearing and in mild acute distress. Eyes: Conjunctivae are normal. PERRL. EOMI. Head: Atraumatic. Nose: No congestion/rhinnorhea. Mouth/Throat: Mucous membranes are mildly dry. Neck: No stridor.   Cardiovascular: Normal rate, irregular rhythm. Grossly normal heart sounds.  Good peripheral circulation. Respiratory: Increased respiratory effort.  No retractions. Lungs with bibasilar rales. Gastrointestinal: Obese.  Soft and nontender. No distention. No abdominal bruits. No CVA tenderness. Musculoskeletal: No lower extremity tenderness.  2+ BLE nonpitting edema.  No joint effusions. Neurologic:  Normal speech and language. No gross focal  neurologic deficits are appreciated.  Skin:  Skin is warm, dry and intact. No rash noted. Psychiatric: Mood and affect are normal. Speech and behavior are normal.  ____________________________________________   LABS (all labs ordered are listed, but only abnormal results are displayed)  Labs Reviewed  RESP PANEL BY RT-PCR (FLU A&B, COVID) ARPGX2  CBC WITH DIFFERENTIAL/PLATELET  COMPREHENSIVE METABOLIC PANEL  BRAIN NATRIURETIC PEPTIDE  URINALYSIS, COMPLETE (UACMP) WITH MICROSCOPIC  PROTIME-INR  MAGNESIUM  TROPONIN I (HIGH SENSITIVITY)  TROPONIN I (HIGH SENSITIVITY)   ____________________________________________  EKG  ED ECG REPORT I, Krystalle Pilkington J, the attending physician, personally viewed and interpreted this ECG.   Date: 07/31/2021  EKG Time: 0011  Rate: 99  Rhythm: atrial fibrillation, rate 99  Axis: LAD  Intervals: protect  QTC 522  ST&T Change: Nonspecific  ED ECG REPORT I, Jeptha Hinnenkamp  J, the attending physician, personally viewed and interpreted this ECG.   Date: 07/31/2021  EKG Time: 0016  Rate: 90  Rhythm: atrial fibrillation, rate 90  Axis: Normal  Intervals:right bundle branch block  ST&T Change: Nonspecific   ____________________________________________  RADIOLOGY I, Natilie Krabbenhoft J, personally viewed and evaluated these images (plain radiographs) as part of my medical decision making, as well as reviewing the written report by the radiologist.  ED MD interpretation: Pulmonary vascular congestion  Official radiology report(s): DG Chest Port 1 View  Result Date: 07/31/2021 CLINICAL DATA:  Shortness of breath EXAM: PORTABLE CHEST 1 VIEW COMPARISON:  07/23/2021 FINDINGS: Right dialysis catheter and left pacer/ICD remain in place, unchanged. Cardiomegaly, vascular congestion. Possible mild interstitial edema. No effusions or acute bony abnormality. IMPRESSION: Cardiomegaly with vascular congestion, possible mild interstitial edema. Electronically Signed   By:  Rolm Baptise M.D.   On: 07/31/2021 00:39    ____________________________________________   PROCEDURES  Procedure(s) performed (including Critical Care):  .1-3 Lead EKG Interpretation  Date/Time: 07/31/2021 12:38 AM Performed by: Paulette Blanch, MD Authorized by: Paulette Blanch, MD     Interpretation: abnormal     ECG rate:  90   ECG rate assessment: normal     Rhythm: atrial fibrillation     Ectopy: none     Conduction: normal   Comments:     Patient placed on monitor to evaluate for arrhythmias   CRITICAL CARE Performed by: Paulette Blanch   Total critical care time: 60 minutes  Critical care time was exclusive of separately billable procedures and treating other patients.  Critical care was necessary to treat or prevent imminent or life-threatening deterioration.  Critical care was time spent personally by me on the following activities: development of treatment plan with patient and/or surrogate as well as nursing, discussions with consultants, evaluation of patient's response to treatment, examination of patient, obtaining history from patient or surrogate, ordering and performing treatments and interventions, ordering and review of laboratory studies, ordering and review of radiographic studies, pulse oximetry and re-evaluation of patient's condition.  ____________________________________________   INITIAL IMPRESSION / ASSESSMENT AND PLAN / ED COURSE  As part of my medical decision making, I reviewed the following data within the [place] notes reviewed and incorporated, Labs reviewed, EKG interpreted, Old chart reviewed, Radiograph reviewed, Discussed with admitting physician, and Notes from prior ED visits     70 year old female presenting with respiratory distress. Differential includes, but is not limited to, viral syndrome, bronchitis including COPD exacerbation, pneumonia, reactive airway disease including asthma, CHF including exacerbation  with or without pulmonary/interstitial edema, pneumothorax, ACS, thoracic trauma, and pulmonary embolism.   Will obtain cardiac panel, chest x-ray.  No STEMI criteria on EKG; will cancel code STEMI.  Pacer pads applied.  Will administer amiodarone bolus.  Clinical Course as of 07/31/21 0250  Thu Jul 31, 2021  0027 Discussed with cardiologist Dr. Read Drivers who agrees with canceling code STEMI. [JS]  0032 Second episode of brief V. tach status post pacer firing since being in the ED.  In addition to amiodarone bolus, will place on amiodarone drip. [JS]  0223 Delay secondary to computer downtime.  Labs received via fax from laboratory.  ABG noted.  No further episodes of V. tach.  Patient resting.  Will discuss with hospital services for admission. [JS]    Clinical Course User Index [JS] Paulette Blanch, MD     ____________________________________________   FINAL CLINICAL IMPRESSION(S) / ED DIAGNOSES  Final  diagnoses:  Ventricular tachycardia ([place])  Respiratory distress  ESRD (end stage renal disease) on dialysis [facility])  Acute pulmonary edema [facility])     ED Discharge Orders     None        Note:  This document was prepared using Dragon voice recognition software and may include unintentional dictation errors.    Paulette Blanch, MD 07/31/21 [phone]

## 2021-07-31 NOTE — ED Notes (Signed)
Linda Quinn at [facility] [place] called to cancel stemi per Dr. Beather Arbour

## 2021-07-31 NOTE — ED Notes (Signed)
Taped blue magnet to pt's chest per provider order. Discussed with pt prior to magnet application and she verbalized that she understands that the magnet will deactivate her AICD which will prevent shocks from being delivered in the event of cardiac arrhythmia. She understands that, if she experiences an adverse cardiac event, her AICD will not fire. Applied to left upper chest with silk tape and labeled with date, time, and initials.

## 2021-07-31 NOTE — Consult Note (Signed)
Cardiology Consultation:   Patient ID: Linda Quinn MRN: [id number]; DOB: [birth year]  Admit date: 07/31/2021 Date of Consult: 07/31/2021  PCP:  Townsend Roger, MD   [facility]  Cardiologist:  Aurther Loft, MD [facility]) Advanced Practice Provider:  No care team member to display Electrophysiologist:  None [facility]  Patient Profile:   Linda Quinn is a 70 y.o. female with a hx of hx of PAF on Coumadin, history of Medtronic ICD in 12/2017 with further details unclear followed by Rosario Adie, ESRD on HD TTS, bedbound status, anemia of chronic disease, history of PE, and obesity who is being seen today for the evaluation of VT with ICD firing at the request of Dr. Posey Pronto.  History of Present Illness:   Linda Quinn has previously received her cardiology care from [facility], undergoing Medtronic ICD implantation in 12/2017 with further details being unclear.  No records available in Epic or following a direct query of [facility].   She previously was admitted after pesenting to [facility] on 8/3 with worsening abdominal cellulitis and pain with associated distention and oozing ulcer without associated fever or chills.  Her ICD had been beeping for the past several days. When device was interrogated in the ED, she had 1 episode of sustained VT on 7/27 that was successfully treated with asx shock.  She was also noted to have 4 episodes of NSVT and innumerable episodes of atrial tach/A. fib.  OptiVol readings consistent with massive volume overload, dating back to 11/2020.  Vitals nl. HS Tn 60, Hgb 9.6, WBC 10.9, sodium 134, BUN 28, Cr 4.54, K 3.7, Mg 2.3.  CXR with cardiomegaly with pulmonary vascular congestion.  CT abdomen pelvis showed cardiomegaly with trace left pleural effusion as well as diffuse skin thickening with extensive subcutaneous edema consistent with anasarca and a small amount of abdominal pelvic ascites.  She had undergone hemodialysis earlier same day, though notes also  indicated her living facility would not take her to follow-up appointments or to dialysis. It was discovered she was getting HD from a facility on [address] by [facility]. HD was recommended. Echo as below showed EF less than 20%. It was also noted that, with recurrent VT, amiodarone would be recommended.   Today, 07/31/2021, she presents to [facility] emergency department with report of shortness of breath since last night. EMS was reportedly contacted for SOB and hypoxia at her SNF. Upon EMS arrival, pt had episode of sustained VT with firing of pacer per EMR (no formal interrogation yet). Since in the ED, another episode of sustained VT noted and confirmed on telemetry at 0:26AM.  EKG at initial presentation suggestive of underlying Afib. CODE STEMI initially called, later cancelled by [facility]. Unfortunately, history is limited by patient's current severity of her illness.  On exam, she is relatively somnolent and unable to keep her eyes open. A foot wound is noted. She reports earlier SOB and that she is not normally on [place] O2. She reports that she has not obtained hemodialysis since her last admission.  She denies any chest pain. She reports ICD shock as asx. No tachypalpitations.  She denies any presyncope or syncope.  Initial vitals significant for soft BP.  Labs show high-sensitivity troponin 36, 41.  BNP significantly elevated at greater than 4500.0.  CXR with vascular congestion and possible mild interstitial edema. INR 3.0.  Potassium 4.1, magnesium 2.4.  Blood counts show ongoing anemia likely of chronic dz.  She was started on IV amiodarone and lidocaine  patch.  Cardiology consulted.    Past Medical History:  Diagnosis Date   CHF (congestive heart failure) (HCC)    Diabetes mellitus without complication (HCC)    Paroxysmal atrial fibrillation (HCC)    Renal disorder    Respiratory failure ([place])    Sleep apnea     No past surgical history on file.   Home Medications:  Prior to Admission  medications   Medication Sig Start Date End Date Taking? Authorizing Provider  aspirin EC 81 MG tablet Take 81 mg by mouth daily. Swallow whole.    [provider]  calcitRIOL (ROCALTROL) 0.5 MCG capsule Take 0.5 mcg by mouth daily.    [provider]  calcium carbonate (TUMS - DOSED IN MG ELEMENTAL CALCIUM) 500 MG chewable tablet Chew 2 tablets (400 mg of elemental calcium total) by mouth 2 (two) times daily. 07/28/21   Fritzi Mandes, MD  doxycycline (VIBRA-TABS) 100 MG tablet Take 1 tablet (100 mg total) by mouth every 12 (twelve) hours for 5 days. 07/28/21 08/02/21  Fritzi Mandes, MD  gabapentin (NEURONTIN) 100 MG capsule Take 100 mg by mouth 2 (two) times daily.    [provider]  insulin glargine (LANTUS) 100 UNIT/ML injection Inject 10 Units into the skin daily.    [provider]  insulin lispro (HUMALOG KWIKPEN) 100 UNIT/ML KwikPen Inject 0-10 Units into the skin 3 (three) times daily.    [provider]  lidocaine (LIDODERM) 5 % Place 1 patch onto the skin daily. Remove & Discard patch within 12 hours or as directed by MD    [provider]  loratadine (CLARITIN) 10 MG tablet Take 10 mg by mouth daily.    [provider]  melatonin 3 MG TABS tablet Take 3 mg by mouth at bedtime.    [provider]  midodrine (PROAMATINE) 10 MG tablet Take 10 mg by mouth 3 (three) times daily.    [provider]  polyethylene glycol (MIRALAX / GLYCOLAX) 17 g packet Take 17 g by mouth daily.    [provider]  senna-docusate (SENOKOT-S) 8.6-50 MG tablet Take 2 tablets by mouth at bedtime.    [provider]  vitamin C (ASCORBIC ACID) 500 MG tablet Take 500 mg by mouth daily.    [provider]  warfarin (COUMADIN) 2.5 MG tablet Take 2.5 mg by mouth daily.    [provider]    Inpatient Medications: Scheduled Meds:  vitamin C  500 mg Oral Daily   aspirin EC  81 mg Oral Daily   calcitRIOL  0.5  mcg Oral Daily   calcium carbonate  2 tablet Oral BID   gabapentin  100 mg Oral BID   insulin aspart  0-9 Units Subcutaneous TID AC & HS   insulin glargine-yfgn  10 Units Subcutaneous Daily   lidocaine  1 patch Transdermal Q24H   loratadine  10 mg Oral Daily   melatonin  3 mg Oral QHS   midodrine  10 mg Oral TID WC   polyethylene glycol  17 g Oral Daily   senna-docusate  2 tablet Oral QHS   warfarin  2.5 mg Oral Daily   Continuous Infusions:  sodium chloride 50 mL/hr at 07/31/21 0534   amiodarone 30 mg/hr (07/31/21 0640)   PRN Meds: acetaminophen **OR** acetaminophen, magnesium hydroxide, ondansetron **OR** ondansetron (ZOFRAN) IV, traZODone  Allergies:    Allergies  Allergen Reactions   Ciprofloxacin Other (See Comments)   Duricef [Cefadroxil] Hives   Sulfamethoxazole-Trimethoprim Other (  See Comments)    Social History:   Social History   Socioeconomic History   Marital status: Single    Spouse name: Not on file   Number of children: Not on file   Years of education: Not on file   Highest education level: Not on file  Occupational History   Not on file  Tobacco Use   Smoking status: Former    Types: Cigarettes   Smokeless tobacco: Never  Substance and Sexual Activity   Alcohol use: Not Currently   Drug use: Not Currently   Sexual activity: Not Currently  Other Topics Concern   Not on file  Social History Narrative   Not on file   Social Determinants of Health   Financial Resource Strain: Not on file  Food Insecurity: Not on file  Transportation Needs: Not on file  Physical Activity: Not on file  Stress: Not on file  Social Connections: Not on file  Intimate Partner Violence: Not on file    Family History:    Family History  Problem Relation Age of Onset   Hypertension Mother      ROS:  Please see the history of present illness.  Review of Systems  Unable to perform ROS: Acuity of condition  All other systems reviewed and are negative.  All  other ROS reviewed and negative.     Physical Exam/Data:   Vitals:   07/31/21 0545 07/31/21 0641 07/31/21 0715 07/31/21 0739  BP: (!) 116/46 (!) 99/58 107/64 (!) 120/57  Pulse:  70  67  Resp: '11 16 20 15  '$ Temp:      TempSrc:      SpO2:  100%  100%   No intake or output data in the 24 hours ending 07/31/21 0742 Last 3 Weights 07/23/2021 07/23/2021 07/10/2021  Weight (lbs) 313 lb 4.4 oz 289 lb 285 lb  Weight (kg) 142.1 kg 131.09 kg 129.275 kg     There is no height or weight on file to calculate BMI.  General: Obese female, somnolent on exam HEENT: normal Lymph: no adenopathy Neck: JVP elevated to the angle of the mandible Cardiac:  normal S1, S2; RRR; no murmur  Lungs: Bilateral distant breath sounds, clear to auscultation bilaterally from anterior auscultation only and with patient taking shallow breaths given somnolent Abd: soft, nontender, no hepatomegaly  Ext: 2+ bilateral lower extremity edema, left lower extremity toe wound open Musculoskeletal: Left lower extremity toe wound Skin: warm and dry  Neuro: Somnolent, opens eyes to sternal rub Psych: Somnolent  EKG:  The EKG was personally reviewed and demonstrates:  AV paced rhythm with ventricular rate 99bpm and likely underlying atrial fibrillation rhythm, QTC 522 and prolonged Telemetry:  Telemetry was personally reviewed and demonstrates: Currently atrial paced rhythm with telemetry reviewed and showing history of both atrial and ventricular paced rhythm.  Earlier episode of sustained ventricular tachycardia at 0:26 this AM  Relevant CV Studies: Echo 07/2021  1. Left ventricular ejection fraction, by estimation, is <20%. The left  ventricle has severely decreased function. The left ventricle demonstrates  global hypokinesis. The left ventricular internal cavity size was  moderately dilated. There is mild left  ventricular hypertrophy. Left ventricular diastolic parameters are  consistent with Grade II diastolic dysfunction  (pseudonormalization).   2. Right ventricular systolic function is moderately reduced. The right  ventricular size is moderately enlarged. There is moderately elevated  pulmonary artery systolic pressure.   3. Left atrial size was severely dilated.   4. Right atrial  size was moderately dilated.   5. The mitral valve is normal in structure. Mild mitral valve  regurgitation. No evidence of mitral stenosis. Moderate mitral annular  calcification.   6. Tricuspid valve regurgitation is moderate.   7. The aortic valve is normal in structure. Aortic valve regurgitation is  not visualized. Mild to moderate aortic valve sclerosis/calcification is  present, without any evidence of aortic stenosis.   Laboratory Data:  High Sensitivity Troponin:   Recent Labs  Lab 07/10/21 0935 07/10/21 1144 07/23/21 1313 07/31/21 0130 07/31/21 0330  TROPONINIHS 27* 25* 60* 36* 41*     Chemistry Recent Labs  Lab 07/26/21 0443 07/27/21 0904 07/31/21 0130  NA 135 135 130*  K 3.3* 3.4* 4.1  CL 97* 95* 92*  CO2 '28 28 23  '$ GLUCOSE 143* 132* 159*  BUN 14 13 26*  CREATININE 3.12* 3.06* 4.67*  CALCIUM 8.2* 8.3* 8.5*  GFRNONAA 16* 16* 10*  ANIONGAP '10 12 15    '$ Recent Labs  Lab 07/31/21 0130  PROT 7.1  ALBUMIN 2.6*  AST 16  ALT 10  ALKPHOS 127*  BILITOT 1.8*   Hematology Recent Labs  Lab 07/26/21 0443 07/27/21 0904 07/31/21 0025  WBC 9.0 9.5 9.0  RBC 3.67* 3.71* 3.78*  HGB 9.6* 9.8* 10.4*  HCT 33.6* 34.1* 35.7*  MCV 91.6 91.9 94.4  MCH 26.2 26.4 27.5  MCHC 28.6* 28.7* 29.1*  RDW 23.2* 23.5* 23.9*  PLT 221 242 298   BNP Recent Labs  Lab 07/31/21 0025  BNP >4,500.0*    DDimer No results for input(s): DDIMER in the last 168 hours.   Radiology/Studies:  US ARTERIAL ABI (SCREENING LOWER EXTREMITY)  Result Date: 07/28/2021 CLINICAL DATA:  70 year old female with PA D EXAM: NONINVASIVE PHYSIOLOGIC VASCULAR STUDY OF BILATERAL LOWER EXTREMITIES TECHNIQUE: Evaluation of both lower  extremities was performed at rest, including calculation of ankle-brachial indices, multiple segmental pressure evaluation, segmental Doppler and segmental pulse volume recording. COMPARISON:  None. FINDINGS: Right ABI:  0.52 Left ABI:  0.54 Right Lower Extremity: Segmental doppler at the right ankle demonstrates monophasic waveforms Left Lower Extremity: Segmental Doppler at the left ankle demonstrates monophasic waveforms IMPRESSION: Resting ABI of the bilateral lower extremity in the moderate range arterial occlusive disease. Segmental exam at the ankles demonstrates more proximal arterial occlusive disease, with monophasic waveforms throughout. Signed, Dulcy Fanny. Dellia Nims, RPVI [facility] Electronically Signed   By: Corrie Mckusick D.O.   On: 07/28/2021 09:47   DG Chest Port 1 View  Result Date: 07/31/2021 CLINICAL DATA:  Shortness of breath EXAM: PORTABLE CHEST 1 VIEW COMPARISON:  07/23/2021 FINDINGS: Right dialysis catheter and left pacer/ICD remain in place, unchanged. Cardiomegaly, vascular congestion. Possible mild interstitial edema. No effusions or acute bony abnormality. IMPRESSION: Cardiomegaly with vascular congestion, possible mild interstitial edema. Electronically Signed   By: Rolm Baptise M.D.   On: 07/31/2021 00:39     Assessment and Plan:   VT with ICD shock, NSVT, AT/AF -- Presents to [facility] with shortness of breath and volume overload and report of further AICD discharge (including AICD discharges noted in the ED per EMR and confirmed on telemetry as occurring at 0:26 today).  Previous and recent admission in the setting of sustained VT 7/27 with failed ATP s/p successful shock x1, NSVT, AT/AF.  On review of EMR, no formal interrogation yet in the ED or per ICD representative. Current presentation similar to that from cardiology consultation 07/24/21. Most recent echo as above  with EF <20%. -- Recommend formal ICD interrogation  via ED or device representative. --No beta-blocker at this time, given her low output heart failure and chronic hypotension on HD, requiring midodrine. --Most recent electrolytes WNL. Ordered TSH. --Continue IV amiodarone with transition to oral amiodarone before discharge.  Recommend monitoring labs for amiodarone, including LFTs and TSH.  Baseline chest x-ray obtained.  -- Recommend she establish with a local EP as previously recommended.  Volume overload in the context of ESRD on hemodialysis --Presents short of breath with most recent echo showing EF less than 20%, LV global hypokinesis, G2 DD, mild LVH, moderate LVE, moderately reduced RV SF, moderate RVE, moderately elevated PASP, severe LAE, moderate R AE, mild MR, moderate mitral calcification, moderate TR.  As above, recommend interrogation of device, which will allow for formal device reading and also allow [place] to obtain an OptiVol reading.  Hemodialysis recommended, as she remains massively volume overloaded.  BNP greater than 45,000.  She denies attending hemodialysis since discharge, though she is not the best historian.  Recommend consultation of nephrology.  Escalation of GDMT limited by hypotension.  As above, continue to avoid beta-blocker in the setting of low output heart failure and chronic hypotension.  Overall prognosis poor, given low output heart failure with ESRD on hemodialysis.  Atrial fibrillation --Known history of Afib. Previous device interrogation shows prolonged episodes of atrial tachycardia/atrial fibrillation.  EKG today shows AV pacing and most recent telemetry with atrial pacing only.  She is not on a beta-blocker due to low output heart failure and chronic hypotension, requiring midodrine in the context of ESRD on hemodialysis.  Most recent INR 3.0.  Continue PTA warfarin per pharmacy adjustments.  Continue to monitor INR.  DM2 --SSI, per IM.  LLE foot wound --Per IM.   Hypotension --Caution with BB,  antihypertensives.   Recent abdominal cellulitis with draining ulcer --Per IM.  For questions or updates, please contact [facility] Please consult [email or web address] for contact info under    Signed, Arvil Chaco, PA-C  07/31/2021 7:42 AM

## 2021-07-31 NOTE — Consult Note (Signed)
ANTICOAGULATION CONSULT NOTE - Initial Consult  Pharmacy Consult for warfarin Indication: atrial fibrillation  Allergies  Allergen Reactions   Ciprofloxacin Other (See Comments)   Duricef [Cefadroxil] Hives   Sulfamethoxazole-Trimethoprim Other (See Comments)     Vital Signs: Temp: 97.6 F (36.4 C) (08/11 1508) Temp Source: Oral (08/11 1508) BP: 105/61 (08/11 1508) Pulse Rate: 69 (08/11 1508)  Labs: Recent Labs    07/31/21 0025 07/31/21 0130 07/31/21 0330 07/31/21 0723  HGB 10.4*  --   --  10.3*  HCT 35.7*  --   --  36.2  PLT 298  --   --  254  LABPROT 30.9*  --   --   --   INR 3.0*  --   --   --   CREATININE  --  4.67*  --  4.68*  TROPONINIHS  --  36* 41*  --     Estimated Creatinine Clearance: 17.5 mL/min (A) (by C-G formula based on SCr of 4.68 mg/dL (H)).   Medical History: Past Medical History:  Diagnosis Date   CHF (congestive heart failure) ([place])    Diabetes mellitus without complication (HCC)    Paroxysmal atrial fibrillation (HCC)    Renal disorder    Respiratory failure (HCC)    Sleep apnea     Medications:  PTA warfarin regimen per nursing administration guide: 2.5 mg tablet daily (TWD = 17.5 mg) PTA: doxycycline 100 mg BID new outpatient start 07/28/21 - 08/02/21 ; last dose 8/10 @ 2000 07/31/21: New amiodarone IV start   Assessment: 70 y.o. female with a hx of hx of PAF on Coumadin (INR goal 2-3), ESRD on HD TThSat, ICD presented to ED for VT w/ ICD firing. Patient with recent start of outpatient doxycycline for cellulitis. INR 3.0 (therapeutic) on admission  DD-interactions: --amiodarone -- new start --doxycycline -- new start 07/28/21 -- planned through 08/02/21  Albumin: 2.6  Date INR Warfarin Dose  8/10 -- 2.5 mg (PTA) 8/11 3.0 1.5 mg   Goal of Therapy:  INR 2-3 Monitor platelets by anticoagulation protocol: Yes   Plan:  INR 3.0 (therapeutic) on admission Will empirically reduce warfarin dose by 40% given new amiodarone start and  antibiotics and give 1.5 mg dose x 1 tonight INR daily CBC at least every 3 days    Dorothe Pea, PharmD, BCPS Clinical Pharmacist   07/31/2021,3:54 PM

## 2021-07-31 NOTE — Progress Notes (Signed)
   07/31/21 0015  Clinical Encounter Type  Visited With Patient not available  Visit Type Initial;Spiritual support  Chaplain Burris responded to a code STEMI for Linda Quinn. Daryel November bore witness to care and offered silent prayer. Code STEMI was cancelled but Pt still receiving urgent care; will check for family presence and recommend for follow-up.

## 2021-07-31 NOTE — Progress Notes (Signed)
[facility] [place] NAME: Quaneisha Apollo    MR#:  [id number]  DATE OF BIRTH:  [birth year]  SUBJECTIVE:  patient was just discharge two days ago to her facility. She comes back after her AICD fired couple times at the facility. She also had some shortness of breath currently placed on high flow nasal cannula oxygen.  patient during my evaluation is awake and alert. No family at bedside  REVIEW OF SYSTEMS:   Review of Systems  Constitutional:  Negative for chills, fever and weight loss.  HENT:  Negative for ear discharge, ear pain and nosebleeds.   Eyes:  Negative for blurred vision, pain and discharge.  Respiratory:  Positive for shortness of breath. Negative for sputum production, wheezing and stridor.   Cardiovascular:  Positive for leg swelling. Negative for chest pain, palpitations, orthopnea and PND.  Gastrointestinal:  Negative for abdominal pain, diarrhea, nausea and vomiting.  Genitourinary:  Negative for frequency and urgency.  Musculoskeletal:  Negative for back pain and joint pain.  Neurological:  Positive for weakness. Negative for sensory change, speech change and focal weakness.  Psychiatric/Behavioral:  Negative for depression and hallucinations. The patient is not nervous/anxious.   Tolerating Diet: Tolerating PT:   DRUG ALLERGIES:   Allergies  Allergen Reactions   Ciprofloxacin Other (See Comments)   Duricef [Cefadroxil] Hives   Sulfamethoxazole-Trimethoprim Other (See Comments)    VITALS:  Blood pressure 109/73, pulse 70, temperature 97.6 F (36.4 C), temperature source Oral, resp. rate 14, SpO2 100 %.  PHYSICAL EXAMINATION:   Physical Exam  GENERAL:  70 y.o.-year-old patient lying in the bed with no acute distress. Morbidly obese  LUNGS: decreased breath sounds bilaterally, no wheezing, rales, rhonchi. No use of accessory muscles of respiration.  CARDIOVASCULAR: S1, S2 normal. No murmurs, rubs, or gallops.   ABDOMEN: Soft, nontender, nondistended. Morbidly obese. Chronic midline abd wound+ EXTREMITIES: severe bilateral edema b/l.    NEUROLOGIC: non focal PSYCHIATRIC:  patient is alert and awake SKIN:  Pressure Injury 07/23/21 Ankle Left;Posterior Stage 2 -  Partial thickness loss of dermis presenting as a shallow open injury with a red, pink wound bed without slough. epithelialized (Active)  07/23/21 2315  Location: Ankle  Location Orientation: Left;Posterior  Staging: Stage 2 -  Partial thickness loss of dermis presenting as a shallow open injury with a red, pink wound bed without slough.  Wound Description (Comments): epithelialized  Present on Admission: Yes     Pressure Injury Leg Right;Posterior Stage 2 -  Partial thickness loss of dermis presenting as a shallow open injury with a red, pink wound bed without slough. epithelialized (Active)     Location: Leg  Location Orientation: Right;Posterior  Staging: Stage 2 -  Partial thickness loss of dermis presenting as a shallow open injury with a red, pink wound bed without slough.  Wound Description (Comments): epithelialized  Present on Admission: Yes   LABORATORY PANEL:  CBC Recent Labs  Lab 07/31/21 0723  WBC 8.1  HGB 10.3*  HCT 36.2  PLT 254    Chemistries  Recent Labs  Lab 07/31/21 0130 07/31/21 0723  NA 130* 132*  K 4.1 4.1  CL 92* 93*  CO2 23 25  GLUCOSE 159* 156*  BUN 26* 27*  CREATININE 4.67* 4.68*  CALCIUM 8.5* 8.6*  MG 2.4  --   AST 16  --   ALT 10  --   ALKPHOS 127*  --   BILITOT 1.8*  --  Cardiac Enzymes No results for input(s): TROPONINI in the last 168 hours. RADIOLOGY:  DG Chest Port 1 View  Result Date: 07/31/2021 CLINICAL DATA:  Shortness of breath EXAM: PORTABLE CHEST 1 VIEW COMPARISON:  07/23/2021 FINDINGS: Right dialysis catheter and left pacer/ICD remain in place, unchanged. Cardiomegaly, vascular congestion. Possible mild interstitial edema. No effusions or acute bony abnormality.  IMPRESSION: Cardiomegaly with vascular congestion, possible mild interstitial edema. Electronically Signed   By: Rolm Baptise M.D.   On: 07/31/2021 00:39   ASSESSMENT AND PLAN:  Linda Quinn is a 70 y.o. female with a hx of hx of PAF on Coumadin, history of [employer] ICD in 12/2017 with further details unclear followed by [facility], ESRD on HD TTS, bedbound status, anemia of chronic disease, history of PE, and obesity who came to the ER for the evaluation of VT with ICD firing at the facility.  Refractory Ventricular Tachycardia/NSVT/AT/AF AICD firing  --Cardiology consult with Dr Fletcher Anon --pt currently on IV amiodarone gtt ---during my eval HR 70's --cont warfarin--pharmacy to dose --per Dr Reginal Lutes prognosis given <15% EF and refractory VT despite IV amiodarone --Palliative care to see pt  Acute on chronic systolic heart failure with anasarca: --Last echocardiogram showed LVEF of 10-15% --Volume status managed with HD. 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 -- Remains afebrile. No foul-smelling discharge. White count normal.  --resume PO doxycycline to complete a 10 day course (from previous admission) -- local dressing changes as per instruction   ESRD on HD: Nephrology on board   Morbid obesity:  Body mass index is 47.63 kg/m.    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 -- Resume sliding scale insulin and Lantus home dose  patient overall has a very poor prognosis given refractory ventricular tachycardia and severe cardiomyopathy. Palliative care consulted.    Consults : cardiology, nephrology, palliative care CODE STATUS: full DVT Prophylaxis : warfarin Level of care: Stepdown Status is: Inpatient  Remains inpatient appropriate because:Inpatient level of care appropriate due to severity of illness  Dispo: The  patient is from: SNF              Anticipated d/c is to:  TBD              Patient currently is not medically stable to d/c.   Difficult to place patient No        TOTAL TIME TAKING CARE OF THIS PATIENT: 35 minutes.  >50% time spent on counselling and coordination of care  Note: This dictation was prepared with Dragon dictation along with smaller phrase technology. Any transcriptional errors that result from this process are unintentional.  Fritzi Mandes M.D    [facility]   CC: Primary care physician; Townsend Roger, MD Patient ID: Linda Quinn, female   DOB: [birth year], 70 y.o.   MRN: [id number]

## 2021-07-31 NOTE — ED Notes (Signed)
Provider at bedside

## 2021-07-31 NOTE — H&P (Addendum)
[place]   PATIENT NAME: Linda Quinn    MR#:  [id number]  DATE OF BIRTH:  May 15, 1951  DATE OF ADMISSION:  07/31/2021  PRIMARY CARE PHYSICIAN: Townsend Roger, MD   Patient is coming from: Home  REQUESTING/REFERRING PHYSICIAN: Lurline Hare, MD  CHIEF COMPLAINT:  Shortness of breath and AICD firing  HISTORY OF PRESENT ILLNESS:  Linda Quinn is a 69 y.o. African-American female with medical history significant for CHF, type 2 diabetes mellitus, paroxysmal atrial fibrillation, end-stage renal disease on hemodialysis and obstructive sleep apnea, as well as paroxysmal atrial fibrillation on Coumadin and history of ventricular tachycardia status post AICD, who was recently admitted here from 8/3 till 8/9 for abdominal wall cellulitis with ulceration of his abdominal  pannus and AICD firing as well as fluid overload, who presents emergency room with acute onset of shortness of breath and lethargy as well as an episode of AICD firing prior to arrival in the hospital.  While she was in the ER she had 2 episodes of ventricular tachycardia associated with AICD firing.  She denies any cough or wheezing.  No nausea or vomiting or diarrhea.  No dysuria, oliguria or hematuria or flank pain.  ED Course: Initial blood pressure was 74/47 and with hydration came up to 96/33 and later BP was 118/96.  Vital signs otherwise were within normal.  Labs revealed a blood glucose of 159 and a BUN of 26 with creatinine of 4.67 magnesium level was 2.4 with albumin 2.6.  High-sensitivity troponin I was 36 and later 41 and BNP was more than 4500.  CBC showed anemia better than previous levels.  Influenza antigens and COVID-19 PCR came back negative.  EKG as reviewed by me : EKG showed AV dual paced rhythm with a rate of 99 Imaging: Chest x-ray showed cardiomegaly with vascular congestion with possible mild interstitial edema.  The patient was given IV amiodarone bolus and drip as well as 2 g of IV magnesium  sulfate.  She will be admitted to a stepdown unit bed for further evaluation and management. PAST MEDICAL HISTORY:   Past Medical History:  Diagnosis Date   CHF (congestive heart failure) (HCC)    Diabetes mellitus without complication (HCC)    Paroxysmal atrial fibrillation (HCC)    Renal disorder    Respiratory failure (HCC)    Sleep apnea     PAST SURGICAL HISTORY:  No past surgical history on file.  She did not recall previous surgeries.  SOCIAL HISTORY:   Social History   Tobacco Use   Smoking status: Former    Types: Cigarettes   Smokeless tobacco: Never  Substance Use Topics   Alcohol use: Not Currently    FAMILY HISTORY:   Family History  Problem Relation Age of Onset   Hypertension Mother     DRUG ALLERGIES:   Allergies  Allergen Reactions   Ciprofloxacin Other (See Comments)   Duricef [Cefadroxil] Hives   Sulfamethoxazole-Trimethoprim Other (See Comments)    REVIEW OF SYSTEMS:   ROS As per history of present illness. All pertinent systems were reviewed above. Constitutional, HEENT, cardiovascular, respiratory, GI, GU, musculoskeletal, neuro, psychiatric, endocrine, integumentary and hematologic systems were reviewed and are otherwise negative/unremarkable except for positive findings mentioned above in the HPI.   MEDICATIONS AT HOME:   Prior to Admission medications   Medication Sig Start Date End Date Taking? Authorizing Provider  aspirin EC 81 MG tablet Take 81 mg by mouth daily. Swallow whole.  [provider]  calcitRIOL (ROCALTROL) 0.5 MCG capsule Take 0.5 mcg by mouth daily.    [provider]  calcium carbonate (TUMS - DOSED IN MG ELEMENTAL CALCIUM) 500 MG chewable tablet Chew 2 tablets (400 mg of elemental calcium total) by mouth 2 (two) times daily. 07/28/21   Fritzi Mandes, MD  doxycycline (VIBRA-TABS) 100 MG tablet Take 1 tablet (100 mg total) by mouth every 12 (twelve) hours for 5 days. 07/28/21 08/02/21  Fritzi Mandes, MD   gabapentin (NEURONTIN) 100 MG capsule Take 100 mg by mouth 2 (two) times daily.    [provider]  insulin glargine (LANTUS) 100 UNIT/ML injection Inject 10 Units into the skin daily.    [provider]  insulin lispro (HUMALOG KWIKPEN) 100 UNIT/ML KwikPen Inject 0-10 Units into the skin 3 (three) times daily.    [provider]  lidocaine (LIDODERM) 5 % Place 1 patch onto the skin daily. Remove & Discard patch within 12 hours or as directed by MD    [provider]  loratadine (CLARITIN) 10 MG tablet Take 10 mg by mouth daily.    [provider]  melatonin 3 MG TABS tablet Take 3 mg by mouth at bedtime.    [provider]  midodrine (PROAMATINE) 10 MG tablet Take 10 mg by mouth 3 (three) times daily.    [provider]  polyethylene glycol (MIRALAX / GLYCOLAX) 17 g packet Take 17 g by mouth daily.    [provider]  senna-docusate (SENOKOT-S) 8.6-50 MG tablet Take 2 tablets by mouth at bedtime.    [provider]  vitamin C (ASCORBIC ACID) 500 MG tablet Take 500 mg by mouth daily.    [provider]  warfarin (COUMADIN) 2.5 MG tablet Take 2.5 mg by mouth daily.    [provider]      VITAL SIGNS:  Blood pressure (!) 118/96, pulse 70, temperature 97.6 F (36.4 C), temperature source Oral, resp. rate 14, SpO2 95 %.  PHYSICAL EXAMINATION:  Physical Exam  GENERAL:  70 y.o.-year-old African-American female patient lying in the bed with no acute distress.  EYES: Pupils equal, round, reactive to light and accommodation. No scleral icterus. Extraocular muscles intact.  HEENT: Head atraumatic, normocephalic. Oropharynx and nasopharynx clear.  NECK:  Supple, no jugular venous distention. No thyroid enlargement, no tenderness.  LUNGS: Slightly diminished bibasilar breath sounds with mild basal rales, without rhonchi or wheezes.  No use of accessory muscles of respiration.  CARDIOVASCULAR: Regular  rate and rhythm, S1, S2 normal. No murmurs, rubs, or gallops.  ABDOMEN: Soft, nondistended, nontender. Bowel sounds present. No organomegaly or mass.  EXTREMITIES: 1+ bilateral lower extremity pitting pedal edema, with no cyanosis, or clubbing.  NEUROLOGIC: Cranial nerves II through XII are intact. Muscle strength 5/5 in all extremities. Sensation intact. Gait not checked.  PSYCHIATRIC: The patient is alert and oriented x 3.  Normal affect and good eye contact. SKIN: No obvious rash, lesion, or ulcer.   LABORATORY PANEL:   CBC Recent Labs  Lab 07/31/21 0025  WBC 9.0  HGB 10.4*  HCT 35.7*  PLT 298   ------------------------------------------------------------------------------------------------------------------  Chemistries  Recent Labs  Lab 07/31/21 0130  NA 130*  K 4.1  CL 92*  CO2 23  GLUCOSE 159*  BUN 26*  CREATININE 4.67*  CALCIUM 8.5*  MG 2.4  AST 16  ALT 10  ALKPHOS 127*  BILITOT 1.8*   ------------------------------------------------------------------------------------------------------------------  Cardiac Enzymes No results for input(s): TROPONINI in the  last 168 hours. ------------------------------------------------------------------------------------------------------------------  RADIOLOGY:  DG Chest Port 1 View  Result Date: 07/31/2021 CLINICAL DATA:  Shortness of breath EXAM: PORTABLE CHEST 1 VIEW COMPARISON:  07/23/2021 FINDINGS: Right dialysis catheter and left pacer/ICD remain in place, unchanged. Cardiomegaly, vascular congestion. Possible mild interstitial edema. No effusions or acute bony abnormality. IMPRESSION: Cardiomegaly with vascular congestion, possible mild interstitial edema. Electronically Signed   By: Rolm Baptise M.D.   On: 07/31/2021 00:39      IMPRESSION AND PLAN:  Active Problems:   AICD discharge 1.  Paroxysmal ventricular tachycardia with subsequent AICD discharge. - The patient will be admitted to a stepdown unit bed. -  We will continue IV amiodarone. - Should be monitored for any recurrent arrhythmias. - Cardiology consult will be obtained. - I notified Dr. Quentin Ore about patient - Serial troponins will be followed.  2.  End-stage renal disease on hemodialysis with fluid overload in the setting of acute on chronic systolic and diastolic CHF. - Nephrology consultation will be obtained for hemodialysis. - I notified Dr. Candiss Norse about the patient. - We will hold diuretic therapy with IV Lasix given the patient hypotension. - The patient had a 2D echo that revealed an EF less than 20 with grade 2 diastolic dysfunction, severe left atrial dilatation and moderate right atrial dilatation with mild mitral regurgitation and moderate trace regurgitation.  3.  Paroxysmal atrial fibrillation. - We will continue her Coumadin.  4.  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 - The patient will be placed on supplemental coverage NovoLog I will continue basal coverage. - We will continue Neurontin.  5.  Chronic hypotension. - We will continue midodrine.  DVT prophylaxis: We will continue Coumadin Code Status: full code.  Family Communication:  The plan of care was discussed in details with the patient (and family). I answered all questions. The patient agreed to proceed with the above mentioned plan. Further management will depend upon hospital course. Disposition Plan: Back to previous home environment Consults called: Cardiology and nephrology. All the records are reviewed and case discussed with ED provider.  Status is: Inpatient  Remains inpatient appropriate because:Hemodynamically unstable, Ongoing diagnostic testing needed not appropriate for outpatient work up, Unsafe d/c plan, IV treatments appropriate due to intensity of illness or inability to take PO, and Inpatient level of care appropriate due to severity of illness  Dispo: The patient is from: SNF              Anticipated d/c is to: SNF               Patient currently is not medically stable to d/c.   Difficult to place patient No   TOTAL TIME TAKING CARE OF THIS PATIENT: 60 minutes.    Christel Mormon M.D on 07/31/2021 at 5:19 AM  [facility]   From 7 PM-7 AM, contact night-coverage [email or web address]  CC: Primary care physician; Townsend Roger, MD

## 2021-07-31 NOTE — ED Notes (Signed)
Patient placed on high flow nasal cannula by respiratory, provider made aware.

## 2021-07-31 NOTE — ED Notes (Signed)
Patient arrives EMS she is lethargic but answers to verbal stimuli. Patient with dialysis port right chest wall. EMS unable to obtain IV site.  Skin is dry and patient on non rebreather mask. Patient had episode of vtach prior to arrival with AICD firing.

## 2021-07-31 NOTE — ED Notes (Signed)
Received message that pt's spouse, Awanda Mink, wants to talk to the patient. RN went to pt's room and pt states that she has already spoken with him. Call back number for spouse [phone] added to patient contact info

## 2021-08-01 DIAGNOSIS — Z794 Long term (current) use of insulin: Secondary | ICD-10-CM

## 2021-08-01 DIAGNOSIS — E1140 Type 2 diabetes mellitus with diabetic neuropathy, unspecified: Secondary | ICD-10-CM

## 2021-08-01 DIAGNOSIS — I959 Hypotension, unspecified: Secondary | ICD-10-CM

## 2021-08-01 DIAGNOSIS — I480 Paroxysmal atrial fibrillation: Secondary | ICD-10-CM

## 2021-08-01 DIAGNOSIS — I5023 Acute on chronic systolic (congestive) heart failure: Secondary | ICD-10-CM

## 2021-08-01 LAB — LACTIC ACID, PLASMA
Lactic Acid, Venous: 2.7 mmol/L (ref 0.5–1.9)
Lactic Acid, Venous: 2.7 mmol/L (ref 0.5–1.9)

## 2021-08-01 LAB — PROTIME-INR
INR: 3 — ABNORMAL HIGH (ref 0.8–1.2)
Prothrombin Time: 30.7 seconds — ABNORMAL HIGH (ref 11.4–15.2)

## 2021-08-01 LAB — BASIC METABOLIC PANEL
Anion gap: 12 (ref 5–15)
BUN: 21 mg/dL (ref 8–23)
CO2: 26 mmol/L (ref 22–32)
Calcium: 8.6 mg/dL — ABNORMAL LOW (ref 8.9–10.3)
Chloride: 95 mmol/L — ABNORMAL LOW (ref 98–111)
Creatinine, Ser: 3.96 mg/dL — ABNORMAL HIGH (ref 0.44–1.00)
GFR, Estimated: 12 mL/min — ABNORMAL LOW (ref >60)
Glucose, Bld: 138 mg/dL — ABNORMAL HIGH (ref 70–99)
Potassium: 3.7 mmol/L (ref 3.5–5.1)
Sodium: 133 mmol/L — ABNORMAL LOW (ref 135–145)

## 2021-08-01 LAB — GLUCOSE, CAPILLARY
Glucose-Capillary: 175 mg/dL — ABNORMAL HIGH (ref 70–99)
Glucose-Capillary: 142 mg/dL — ABNORMAL HIGH (ref 70–99)

## 2021-08-01 LAB — MRSA NEXT GEN BY PCR, NASAL: MRSA by PCR Next Gen: NOT DETECTED

## 2021-08-01 LAB — CBG MONITORING, ED: Glucose-Capillary: 151 mg/dL — ABNORMAL HIGH (ref 70–99)

## 2021-08-01 MED ORDER — HYDROMORPHONE HCL 1 MG/ML IJ SOLN: 0.5000 mg | INTRAMUSCULAR | Status: DC | PRN

## 2021-08-01 MED ORDER — CHLORHEXIDINE GLUCONATE CLOTH 2 % EX PADS
6.0000 | MEDICATED_PAD | Freq: Every day | CUTANEOUS | Status: DC
  Administered 2021-08-03 – 2021-08-04 (×2): 6 via TOPICAL

## 2021-08-01 MED ORDER — PENTAFLUOROPROP-TETRAFLUOROETH EX AERO
1.0000 "application " | INHALATION_SPRAY | CUTANEOUS | Status: DC | PRN
  Filled 2021-08-01: qty 30

## 2021-08-01 MED ORDER — AMIODARONE HCL IN DEXTROSE 360-4.14 MG/200ML-% IV SOLN: 30.0000 mg/h | INTRAVENOUS | Status: AC

## 2021-08-01 MED ORDER — INSULIN ASPART 100 UNIT/ML IJ SOLN
0.0000 [IU] | Freq: Three times a day (TID) | INTRAMUSCULAR | Status: DC
  Administered 2021-08-01 – 2021-08-03 (×2): 1 [IU] via SUBCUTANEOUS
  Filled 2021-08-01: qty 1

## 2021-08-01 MED ORDER — WARFARIN - PHARMACIST DOSING INPATIENT
Freq: Every day | Status: DC
  Filled 2021-08-01: qty 1

## 2021-08-01 MED ORDER — INSULIN ASPART 100 UNIT/ML IJ SOLN: 0.0000 [IU] | Freq: Every day | INTRAMUSCULAR | Status: DC

## 2021-08-01 MED ORDER — MIDODRINE HCL 5 MG PO TABS
10.0000 mg | ORAL_TABLET | Freq: Four times a day (QID) | ORAL | Status: DC
  Administered 2021-08-01: 10 mg via ORAL
  Filled 2021-08-01: qty 2

## 2021-08-01 MED ORDER — OXYCODONE HCL 5 MG PO TABS: 5.0000 mg | ORAL_TABLET | ORAL | Status: DC | PRN

## 2021-08-01 MED ORDER — MIDODRINE HCL 5 MG PO TABS
10.0000 mg | ORAL_TABLET | Freq: Four times a day (QID) | ORAL | Status: DC
  Administered 2021-08-01 – 2021-08-04 (×9): 10 mg via ORAL
  Filled 2021-08-01 (×10): qty 2

## 2021-08-01 MED ORDER — LIDOCAINE-PRILOCAINE 2.5-2.5 % EX CREA
1.0000 "application " | TOPICAL_CREAM | CUTANEOUS | Status: DC | PRN
  Filled 2021-08-01: qty 5

## 2021-08-01 MED ORDER — SODIUM CHLORIDE 0.9 % IV SOLN: 100.0000 mL | INTRAVENOUS | Status: DC | PRN

## 2021-08-01 MED ORDER — ACETAMINOPHEN 325 MG PO TABS: 650.0000 mg | ORAL_TABLET | Freq: Four times a day (QID) | ORAL | Status: AC | PRN

## 2021-08-01 MED ORDER — ALTEPLASE 2 MG IJ SOLR: 2.0000 mg | Freq: Once | INTRAMUSCULAR | Status: DC | PRN

## 2021-08-01 MED ORDER — MIDODRINE HCL 10 MG PO TABS: 10.0000 mg | ORAL_TABLET | Freq: Four times a day (QID) | ORAL | Status: AC

## 2021-08-01 MED ORDER — WARFARIN SODIUM 3 MG PO TABS: 1.5000 mg | ORAL_TABLET | Freq: Once | ORAL | Status: DC

## 2021-08-01 MED ORDER — ONDANSETRON 4 MG PO TBDP
4.0000 mg | ORAL_TABLET | Freq: Three times a day (TID) | ORAL | Status: DC | PRN
  Filled 2021-08-01: qty 1

## 2021-08-01 MED ORDER — HEPARIN SODIUM (PORCINE) 1000 UNIT/ML DIALYSIS
1000.0000 [IU] | INTRAMUSCULAR | Status: DC | PRN
  Administered 2021-08-02: 3600 [IU] via INTRAVENOUS_CENTRAL
  Administered 2021-08-02: 1000 [IU] via INTRAVENOUS_CENTRAL

## 2021-08-01 MED ORDER — SODIUM CHLORIDE 0.9 % IV BOLUS
250.0000 mL | Freq: Once | INTRAVENOUS | Status: AC
  Administered 2021-08-01: 250 mL via INTRAVENOUS

## 2021-08-01 MED ORDER — LIDOCAINE HCL (PF) 1 % IJ SOLN
5.0000 mL | INTRAMUSCULAR | Status: DC | PRN
  Filled 2021-08-01: qty 5

## 2021-08-01 MED ORDER — WARFARIN SODIUM 1 MG PO TABS
1.5000 mg | ORAL_TABLET | Freq: Once | ORAL | Status: AC
  Administered 2021-08-01: 1.5 mg via ORAL
  Filled 2021-08-01 (×2): qty 1

## 2021-08-01 NOTE — ED Notes (Signed)
Pt. Provided warm blankets. Repositioned. NAD.

## 2021-08-01 NOTE — ED Notes (Signed)
Transferred pt to hospital bed for comfort.

## 2021-08-01 NOTE — ED Notes (Signed)
Pt given daily meds, gown changed, new blankets provided.

## 2021-08-01 NOTE — ED Notes (Signed)
Pt resting comfortably at this time. Normal rise and fall of chest at this time. NAD noted. BP low, per night shift RN pt is transitioning to comfort care sometime today and no new orders to address BP, amio gtt still infusing.

## 2021-08-01 NOTE — ED Notes (Signed)
Dr. Fletcher Anon notified pt's bp 67/31. Will continue to monitor.

## 2021-08-01 NOTE — Progress Notes (Addendum)
Daily Progress Note   Patient Name: Linda Quinn       Date: 08/01/2021 DOB: Jun 23, 1951  Age: 70 y.o. MRN#: [id number] Attending Physician: Loletha Grayer, MD Primary Care Physician: Nona Dell, Corene Cornea, MD Admit Date: 07/31/2021  Reason for Consultation/Follow-up: Establishing goals of care  Subjective: Spoke with patient this morning. She continued to weigh her options and was unsure of her wishes, and wavering. After much discussion and answered questions patient states she would like to go to hospice. Discussed this with attending Dr. Leslye Peer.  Returned to bedside and primary RN in to bedside with me to talk with patient and confirm wishes. Patient is alert and oriented. She can tell me her name, that she is in the hospital, year is 2022, and she is here because of heart failure. She states she is married but only legally as they do not have a close relationship. She states the "sisters" listed in demographics are actually sister in laws, and states all of her family is dead. She confirms DNR/DNI status and that she wants her ICD turned off so that it will not shock her again- and understands she will die without the shocks that she would otherwise receive.   She states she wants to go to hospice, and a hospice facility in her area. I asked her to verbalize the plan moving forward to me. She states she is going to hospice, and hospice will provide physical care for her (bathing, toileting), make sure she has meals, and provide medications to keep her comfortable until she dies. She states she does not want to continue dialysis any longer. She understands her time is very short and is aware her life is limited to weeks.   She states she does not want any further pokes, or painful procedures. She  states she wants to be kept comfortable until she dies. She has a magnet over her chest currently but will need [employer] to deactivate her ICD.   Attempted to call husband but it only rang with no VM picking up. Advised Dr. Earleen Newport who was able to speak with patient's husband to discuss hospice care. Please see his note. Spoke with nephrology to update.   I completed a MOST form today. The primary RN was present for completion of this form, and the signed original was placed in  the chart. A photocopy was placed in the chart to be scanned into EMR. The patient outlined their wishes for the following treatment decisions:  Cardiopulmonary Resuscitation: Do Not Attempt Resuscitation (DNR/No CPR)  Medical Interventions: Comfort Measures: Keep clean, warm, and dry. Use medication by any route, positioning, wound care, and other measures to relieve pain and suffering. Use oxygen, suction and manual treatment of airway obstruction as needed for comfort. Do not transfer to the hospital unless comfort needs cannot be met in current location.  Antibiotics: No antibiotics (use other measures to relieve symptoms)  IV Fluids: No IV fluids (provide other measures to ensure comfort)  Feeding Tube: No feeding tube     ADDENDUM: Called by nursing and alerted by attending MD that patient wants to go to [facility]. RN at bedside during conversation. Primary MD in to speak with patient. She states she wants to go to [facility] and is willing to continue dialysis and other interventions. Following MD conversation, she states she is very confused and mixed up on what she wants to do. Discussed her diagnoses and prognosis and what care she would want. She states she wants to go to [facility] because it is in [place], and that is closer to her family. She is hopeful her family could visit her there. Discussed that there are hospice facilities in [place]. She states she just needs time to think, she is not sure what she wants. She does insist on no  breathing tube, no CPR, and having the ICD turned off.   New MOST form completed.  I completed a MOST form today and the signed original was placed in the chart. A photocopy was placed in the chart to be scanned into EMR. The patient outlined their wishes for the following treatment decisions:  Cardiopulmonary Resuscitation: Do Not Attempt Resuscitation (DNR/No CPR)  Medical Interventions: Limited Additional Interventions: Use medical treatment, IV fluids and cardiac monitoring as indicated, DO NOT USE intubation or mechanical ventilation. May consider use of less invasive airway support such as BiPAP or CPAP. Also provide comfort measures. Transfer to the hospital if indicated. Avoid intensive care.   Antibiotics: Antibiotics if indicated  IV Fluids: IV fluids if indicated  Feeding Tube: No feeding tube     Length of Stay: 1  Current Medications: Scheduled Meds:   calcium carbonate  2 tablet Oral BID   Chlorhexidine Gluconate Cloth  6 each Topical Q0600   gabapentin  100 mg Oral BID   insulin aspart  0-9 Units Subcutaneous TID AC & HS   insulin glargine-yfgn  10 Units Subcutaneous Daily   loratadine  10 mg Oral Daily   melatonin  2.5 mg Oral QHS   midodrine  10 mg Oral QID   senna-docusate  2 tablet Oral QHS   warfarin  1.5 mg Oral ONCE-1600   Warfarin - Pharmacist Dosing Inpatient   Does not apply q1600    Continuous Infusions:  amiodarone 30 mg/hr (07/31/21 2107)    PRN Meds: acetaminophen **OR** acetaminophen, magnesium hydroxide, ondansetron **OR** ondansetron (ZOFRAN) IV, traZODone  Physical Exam Pulmonary:     Effort: Pulmonary effort is normal.  Neurological:     Mental Status: She is alert.            Vital Signs: BP (!) 100/59   Pulse 70   Temp 97.6 F (36.4 C) (Oral)   Resp 16   Ht _0  (1.727 m)   SpO2 96%   BMI 47.63 kg/m  SpO2: SpO2: 96 %  O2 Device: O2 Device: Nasal Cannula O2 Flow Rate: O2 Flow Rate (L/min): 2 L/min  Intake/output summary:   Intake/Output Summary (Last 24 hours) at 08/01/2021 1301 Last data filed at 07/31/2021 1522 Gross per 24 hour  Intake 60 ml  Output 1931 ml  Net -1871 ml   LBM:   Baseline Weight: Weight:  (UTA due to pt unable to stand, no scale on ED stretcher) Most recent weight: Weight:  (UTA due to pt unable to stand, no scale on ED stretcher)        Patient Active Problem List   Diagnosis Date Noted   AICD discharge 07/31/2021   Respiratory distress    Acute pulmonary edema (HCC)    Acute on chronic systolic CHF (congestive heart failure) (HCC)    Hypotension    Pressure injury of skin 07/24/2021   Abdominal wall cellulitis 07/24/2021   Ventricular tachycardia (HCC)    Pulmonary vascular congestion    ESRD (end stage renal disease) on dialysis ([place])    Obesity, Class III, BMI 40-49.9 (morbid obesity) ([place]) 07/23/2021   Pacemaker malfunction, initial encounter 07/23/2021   Cellulitis of abdominal wall 07/23/2021   AF (paroxysmal atrial fibrillation) ([place]) 07/23/2021   Anemia in chronic kidney disease 07/02/2021   Presence of cardiac pacemaker 07/02/2021   Type 2 diabetes mellitus with diabetic neuropathy, with long-term current use of insulin ([place]) 07/02/2021   Unspecified systolic (congestive) heart failure ([place]) 07/02/2021    Palliative Care Assessment & Plan    Recommendations/Plan: Shift to comfort care.  Needs her ICD deactivated-currently has a magnet on her chest. Will need hospice facility placement.  ADDENDUM: Wants to go to [facility] to be closer to family. She is not sure what she wants to do as far as hospice vs agressive care, but confirms DNR/DNI, ICD off with pacer on.  D/Cing to [facility].     Code Status:    Code Status Orders  (From admission, onward)           Start     Ordered   07/31/21 1608  Do not attempt resuscitation (DNR)  Continuous       Question Answer Comment  In the event of cardiac or respiratory ARREST Do not call a "code blue"   In the event of  cardiac or respiratory ARREST Do not perform Intubation, CPR, defibrillation or ACLS   In the event of cardiac or respiratory ARREST Use medication by any route, position, wound care, and other measures to relive pain and suffering. May use oxygen, suction and manual treatment of airway obstruction as needed for comfort.   Comments MOST form on chart.      07/31/21 1607           Code Status History     Date Active Date Inactive Code Status Order ID Comments User Context   07/31/2021 1606 07/31/2021 1607 DNR [id number]  Asencion Gowda, NP ED   07/31/2021 0307 07/31/2021 1606 Full Code [id number]  Mansy, Arvella Merles, MD ED   07/23/2021 2134 07/29/2021 2219 Full Code [id number]  Athena Masse, MD ED       Prognosis:  < 2 weeks  Discharge Planning: [place] was discussed with primary RN and MD.   Thank you for allowing the Palliative Medicine Team to assist in the care of this patient.   Time In: 9:50 2:00  Time Out: 12:30 3:00 Total Time 160 min 36mn  Prolonged Time Billed  yes  Greater than 50%  of this time was spent counseling and coordinating care related to the above assessment and plan.  Asencion Gowda, NP  Please contact Palliative Medicine Team phone at [phone] for questions and concerns.

## 2021-08-01 NOTE — ED Notes (Signed)
Palliative care provider at bedside

## 2021-08-01 NOTE — ED Notes (Addendum)
Report to [place], Therapist, sports. Pt going to [facility] and bed is ready

## 2021-08-01 NOTE — ED Notes (Signed)
[employer] here to D/C ICD, rep requests a note from physician indicating this. Dr. Earleen Newport contacted via [facility] and stated to keep ICD on until pt gets to [facility] due to transfer.  Rep states he cannot return and neither can on-call rep and that when pt is at [facility] they "can handle it there'.

## 2021-08-01 NOTE — ED Notes (Signed)
Dr. Fletcher Anon notified pt's BP 75/37, states that BP is ok. Will continue to monitor.

## 2021-08-01 NOTE — ED Notes (Signed)
Pt given PO midodrine per MD order. MD states this is to increase BP. When pt was educated on medication and its use and the goal for dialysis pt states "I don't want to do that anymore, I'm tired'.

## 2021-08-01 NOTE — ED Notes (Signed)
This RN contacted [employer] due to them needing to come shut off defib. This delay is stopping pt from being placed on comfort care.  Representative called me back and stated he will be here in 1.5 hour to deactivate defibrillator.    Representative requests an order to deactivate and this RN will let NP know to order.

## 2021-08-01 NOTE — ED Notes (Signed)
MD aware of BP and advises to continue monitoring vitals unless symptoms change. Continue amiodarone until pt has transitioned to comfort care or until orders are changed.

## 2021-08-01 NOTE — Discharge Summary (Signed)
[place] [facility] [place] NAME: Linda Quinn    MR#:  [id number]  DATE OF BIRTH:  1951/01/04  DATE OF ADMISSION:  07/31/2021 ADMITTING PHYSICIAN: Loletha Grayer, MD  DATE OF DISCHARGE: 08/01/2021  PRIMARY CARE PHYSICIAN: Townsend Roger, MD    ADMISSION DIAGNOSIS:  AICD discharge [Z45.02] Hypotension [I95.9]  DISCHARGE DIAGNOSIS:  Active Problems:   Type 2 diabetes mellitus with diabetic neuropathy, with long-term current use of insulin (HCC)   ESRD (end stage renal disease) on dialysis ([place])   Hypotension   AICD discharge   SECONDARY DIAGNOSIS:   Past Medical History:  Diagnosis Date   CHF (congestive heart failure) ([place])    Diabetes mellitus without complication ([place])    Paroxysmal atrial fibrillation ([place])    Renal disorder    Respiratory failure ([place])    Sleep apnea     HOSPITAL COURSE:   Persistent hypotension.  Increase midodrine 10 mg to 4 times daily dosing.  Pressors if needed. AICD firing again in ventricular tachycardia.  Again limited with medication secondary to hypotension.  Currently on amiodarone drip.  Patient declined palliative care and wanted to be transferred over to [facility].  [facility] did accept the patient for transfer when bed available.  Canceled the AICD turned off.  Lactic acid slightly elevated 2.8 Acute on chronic systolic congestive heart failure with anasarca.  EF very low.  Last echocardiogram here showed EF less than 20%.  Limited with medications. End-stage renal disease.  Dialysis as per nephrology. Paroxysmal atrial fibrillation.  Pharmacy dosing Coumadin. 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  Patient on low-dose glargine insulin and sliding scale. 3 stage II decubiti, present on admission, left ankle, right posterior leg and buttock. Recent abdominal wall cellulitis and ulcer.  Treated with antibiotics.  DISCHARGE CONDITIONS:   Guarded  CONSULTS OBTAINED:  Treatment  Team:  Murlean Iba, MD Palliative care  DRUG ALLERGIES:   Allergies  Allergen Reactions   Ciprofloxacin Other (See Comments)   Duricef [Cefadroxil] Hives   Sulfamethoxazole-Trimethoprim Other (See Comments)    DISCHARGE MEDICATIONS:   Allergies as of 08/01/2021       Reactions   Ciprofloxacin Other (See Comments)   Duricef [cefadroxil] Hives   Sulfamethoxazole-trimethoprim Other (See Comments)        Medication List     STOP taking these medications    doxycycline 100 MG tablet Commonly known as: VIBRA-TABS       TAKE these medications    acetaminophen 325 MG tablet Commonly known as: TYLENOL Take 2 tablets (650 mg total) by mouth every 6 (six) hours as needed for mild pain (or Fever >/= 101).   amiodarone 360-4.14 MG/200ML-% Soln Commonly known as: NEXTERONE PREMIX Inject 30 mg/hr into the vein continuous.   aspirin EC 81 MG tablet Take 81 mg by mouth daily. Swallow whole.   calcitRIOL 0.5 MCG capsule Commonly known as: ROCALTROL Take 0.5 mcg by mouth daily.   calcium carbonate 500 MG chewable tablet Commonly known as: TUMS - dosed in mg elemental calcium Chew 2 tablets (400 mg of elemental calcium total) by mouth 2 (two) times daily.   gabapentin 100 MG capsule Commonly known as: NEURONTIN Take 100 mg by mouth 2 (two) times daily.   insulin glargine 100 UNIT/ML injection Commonly known as: LANTUS Inject 10 Units into the skin daily.   insulin lispro 100 UNIT/ML KwikPen Commonly known as: HUMALOG Inject 0-10 Units into the skin 3 (three) times  daily.   lidocaine 5 % Commonly known as: LIDODERM Place 1 patch onto the skin daily. Remove & Discard patch within 12 hours or as directed by MD   loratadine 10 MG tablet Commonly known as: CLARITIN Take 10 mg by mouth daily.   melatonin 3 MG Tabs tablet Take 3 mg by mouth at bedtime.   midodrine 10 MG tablet Commonly known as: PROAMATINE Take 1 tablet (10 mg total) by mouth 4 (four) times  daily. Start taking on: August 02, 2021 What changed: when to take this   polyethylene glycol 17 g packet Commonly known as: MIRALAX / GLYCOLAX Take 17 g by mouth daily.   senna-docusate 8.6-50 MG tablet Commonly known as: Senokot-S Take 2 tablets by mouth at bedtime.   vitamin C 500 MG tablet Commonly known as: ASCORBIC ACID Take 500 mg by mouth daily.   warfarin 3 MG tablet Commonly known as: COUMADIN Take 0.5 tablets (1.5 mg total) by mouth one time only at 4 PM. What changed:  medication strength how much to take when to take this         DISCHARGE INSTRUCTIONS:   Follow-up at [facility] when bed available  If you experience worsening of your admission symptoms, develop shortness of breath, life threatening emergency, suicidal or homicidal thoughts you must seek medical attention immediately by calling 911 or calling your MD immediately  if symptoms less severe.  You Must read complete instructions/literature along with all the possible adverse reactions/side effects for all the Medicines you take and that have been prescribed to you. Take any new Medicines after you have completely understood and accept all the possible adverse reactions/side effects.   Please note  You were cared for by a hospitalist during your hospital stay. If you have any questions about your discharge medications or the care you received while you were in the hospital after you are discharged, you can call the unit and asked to speak with the hospitalist on call if the hospitalist that took care of you is not available. Once you are discharged, your primary care physician will handle any further medical issues. Please note that NO REFILLS for any discharge medications will be authorized once you are discharged, as it is imperative that you return to your primary care physician (or establish a relationship with a primary care physician if you do not have one) for your aftercare needs so that they can  reassess your need for medications and monitor your lab values.    Today   CHIEF COMPLAINT:   Chief Complaint  Patient presents with   Shortness of Breath    HISTORY OF PRESENT ILLNESS:  Linda Quinn  is a 70 y.o. female came back with shortness of breath and AICD firing   VITAL SIGNS:  Blood pressure 103/65, pulse 70, temperature 97.6 F (36.4 C), temperature source Oral, resp. rate 15, height '5\' 8"'$  (1.727 m), SpO2 95 %.   DATA REVIEW:   CBC Recent Labs  Lab 07/31/21 0723  WBC 8.1  HGB 10.3*  HCT 36.2  PLT 254    Chemistries  Recent Labs  Lab 07/31/21 0130 07/31/21 0723 08/01/21 0651  NA 130*   < > 133*  K 4.1   < > 3.7  CL 92*   < > 95*  CO2 23   < > 26  GLUCOSE 159*   < > 138*  BUN 26*   < > 21  CREATININE 4.67*   < > 3.96*  CALCIUM  8.5*   < > 8.6*  MG 2.4  --   --   AST 16  --   --   ALT 10  --   --   ALKPHOS 127*  --   --   BILITOT 1.8*  --   --    < > = values in this interval not displayed.     Microbiology Results  Results for orders placed or performed during the hospital encounter of 07/31/21  Resp Panel by RT-PCR (Flu A&B, Covid) Nasopharyngeal Swab     Status: None   Collection Time: 07/31/21 12:15 AM   Specimen: Nasopharyngeal Swab; Nasopharyngeal(NP) swabs in vial transport medium  Result Value Ref Range Status   SARS Coronavirus 2 by RT PCR NEGATIVE NEGATIVE Final    Comment: (NOTE) SARS-CoV-2 target nucleic acids are NOT DETECTED.  The SARS-CoV-2 RNA is generally detectable in upper respiratory specimens during the acute phase of infection. The lowest concentration of SARS-CoV-2 viral copies this assay can detect is 138 copies/mL. A negative result does not preclude SARS-Cov-2 infection and should not be used as the sole basis for treatment or other patient management decisions. A negative result may occur with  improper specimen collection/handling, submission of specimen other than nasopharyngeal swab, presence of viral  mutation(s) within the areas targeted by this assay, and inadequate number of viral copies(<138 copies/mL). A negative result must be combined with clinical observations, patient history, and epidemiological information. The expected result is Negative.  Fact Sheet for Patients:  EntrepreneurPulse.com.au  Fact Sheet for Healthcare Providers:  IncredibleEmployment.be  This test is no t yet approved or cleared by the [place] FDA and  has been authorized for detection and/or diagnosis of SARS-CoV-2 by FDA under an Emergency Use Authorization (EUA). This EUA will remain  in effect (meaning this test can be used) for the duration of the COVID-19 declaration under Section 564(b)(1) of the Act, 21 U.S.C.section 360bbb-3(b)(1), unless the authorization is terminated  or revoked sooner.       Influenza A by PCR NEGATIVE NEGATIVE Final   Influenza B by PCR NEGATIVE NEGATIVE Final    Comment: (NOTE) The Xpert Xpress SARS-CoV-2/FLU/RSV plus assay is intended as an aid in the diagnosis of influenza from Nasopharyngeal swab specimens and should not be used as a sole basis for treatment. Nasal washings and aspirates are unacceptable for Xpert Xpress SARS-CoV-2/FLU/RSV testing.  Fact Sheet for Patients: EntrepreneurPulse.com.au  Fact Sheet for Healthcare Providers: IncredibleEmployment.be  This test is not yet approved or cleared by the [place] FDA and has been authorized for detection and/or diagnosis of SARS-CoV-2 by FDA under an Emergency Use Authorization (EUA). This EUA will remain in effect (meaning this test can be used) for the duration of the COVID-19 declaration under Section 564(b)(1) of the Act, 21 U.S.C. section 360bbb-3(b)(1), unless the authorization is terminated or revoked.  Performed at [facility], [address] [place], [place] [address]     RADIOLOGY:  [facility] Chest Port 1  View  Result Date: 07/31/2021 CLINICAL DATA:  Shortness of breath EXAM: PORTABLE CHEST 1 VIEW COMPARISON:  07/23/2021 FINDINGS: Right dialysis catheter and left pacer/ICD remain in place, unchanged. Cardiomegaly, vascular congestion. Possible mild interstitial edema. No effusions or acute bony abnormality. IMPRESSION: Cardiomegaly with vascular congestion, possible mild interstitial edema. Electronically Signed   By: Rolm Baptise M.D.   On: 07/31/2021 00:39       Management plans discussed with the patient, family and they are in agreement.  CODE STATUS:     Code Status Orders  (From admission, onward)           Start     Ordered   07/31/21 1608  Do not attempt resuscitation (DNR)  Continuous       Question Answer Comment  In the event of cardiac or respiratory ARREST Do not call a "code blue"   In the event of cardiac or respiratory ARREST Do not perform Intubation, CPR, defibrillation or ACLS   In the event of cardiac or respiratory ARREST Use medication by any route, position, wound care, and other measures to relive pain and suffering. May use oxygen, suction and manual treatment of airway obstruction as needed for comfort.   Comments MOST form on chart.      07/31/21 1607           Code Status History     Date Active Date Inactive Code Status Order ID Comments User Context   07/31/2021 1606 07/31/2021 1607 DNR [id number]  Asencion Gowda, NP ED   07/31/2021 0307 07/31/2021 1606 Full Code [id number]  Mansy, Arvella Merles, MD ED   07/23/2021 2134 07/29/2021 2219 Full Code [id number]  Athena Masse, MD ED       TOTAL TIME TAKING CARE OF THIS PATIENT: 45 minutes.    Loletha Grayer M.D on 08/01/2021 at 4:53 PM  [facility]  CC: Primary care physician; Townsend Roger, MD

## 2021-08-01 NOTE — Progress Notes (Signed)
Patient ID: Linda Quinn, female   DOB: [birth year], 70 y.o.   MRN: [id number] [facility] PROGRESS NOTE  Linda Quinn [id number] DOB: 08-12-1951 DOA: 07/31/2021 PCP: Townsend Roger, MD  HPI/Subjective: Patient this morning and answered a few questions.  I asked her if her AICD went off and she could not tell me.  No complaints of chest pain or shortness of breath.  Brought back in the hospital with AICD firing.  Expressed wishes to be a DO NOT RESUSCITATE and have the AICD turned off with palliative yesterday.  On reevaluation with palliative today patient is interested in comfort care.  Objective: Vitals:   08/01/21 0930 08/01/21 1000  BP: (!) 68/57 (!) 100/59  Pulse: 70 70  Resp: 17 16  Temp:    SpO2: 100% 96%    Intake/Output Summary (Last 24 hours) at 08/01/2021 1201 Last data filed at 07/31/2021 1522 Gross per 24 hour  Intake 60 ml  Output 1931 ml  Net -1871 ml   Filed Weights    ROS: Review of Systems  Respiratory:  Negative for shortness of breath.   Cardiovascular:  Negative for chest pain.  Gastrointestinal:  Negative for abdominal pain, nausea and vomiting.  Exam: Physical Exam HENT:     Head: Normocephalic.     Mouth/Throat:     Pharynx: No oropharyngeal exudate.  Eyes:     General: Lids are normal.     Conjunctiva/sclera: Conjunctivae normal.  Cardiovascular:     Rate and Rhythm: Normal rate and regular rhythm.     Heart sounds: Normal heart sounds, S1 normal and S2 normal.  Pulmonary:     Breath sounds: Examination of the right-lower field reveals decreased breath sounds. Examination of the left-lower field reveals decreased breath sounds. Decreased breath sounds present. No wheezing, rhonchi or rales.  Abdominal:     Palpations: Abdomen is soft.     Tenderness: There is abdominal tenderness.  Musculoskeletal:     Right lower leg: Swelling present.     Left lower leg: Swelling present.  Skin:    General: Skin is warm.     Comments:  Darkened skin right lower abdomen.  Neurological:     Mental Status: She is alert.     Comments: Answers some yes or no questions.      Scheduled Meds:  vitamin C  500 mg Oral Daily   calcitRIOL  0.5 mcg Oral Daily   calcium carbonate  2 tablet Oral BID   Chlorhexidine Gluconate Cloth  6 each Topical Q0600   gabapentin  100 mg Oral BID   insulin aspart  0-9 Units Subcutaneous TID AC & HS   insulin glargine-yfgn  10 Units Subcutaneous Daily   loratadine  10 mg Oral Daily   melatonin  2.5 mg Oral QHS   midodrine  10 mg Oral QID   senna-docusate  2 tablet Oral QHS   warfarin  1.5 mg Oral ONCE-1600   Warfarin - Pharmacist Dosing Inpatient   Does not apply q1600   Continuous Infusions:  amiodarone 30 mg/hr (07/31/21 2107)    Assessment/Plan:  Persistent hypotension.  Midodrine to maintain blood pressure.   AICD firing again and ventricular tachycardia.  Again limited with medication secondary to hypotension.  Currently on amiodarone drip.  Palliative care to have them turn off her AICD today. Acute on chronic systolic congestive heart failure with anasarca.  EF very low at less than 20%.  Limited with medications. End-stage renal disease.  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  Patient on low-dose glargine insulin. Appreciate palliative care consultation.  Conversion over to comfort care measures and hospice.  Spoke with husband on the phone and he hopefully will come in and visit tomorrow.  We will get a transitional care team to look into hospice home options.  Family interested in [facility] facility. To stage II decubiti, present on admission see the full description below.  Pressure Injury 07/23/21 Ankle Left;Posterior Stage 2 -  Partial thickness loss of dermis presenting as a shallow open injury with a red, pink wound bed without slough. epithelialized (Active)   07/23/21 2315  Location: Ankle  Location Orientation: Left;Posterior  Staging: Stage 2 -  Partial thickness loss of dermis presenting as a shallow open injury with a red, pink wound bed without slough.  Wound Description (Comments): epithelialized  Present on Admission: Yes     Pressure Injury Leg Right;Posterior Stage 2 -  Partial thickness loss of dermis presenting as a shallow open injury with a red, pink wound bed without slough. epithelialized (Active)     Location: Leg  Location Orientation: Right;Posterior  Staging: Stage 2 -  Partial thickness loss of dermis presenting as a shallow open injury with a red, pink wound bed without slough.  Wound Description (Comments): epithelialized  Present on Admission: Yes       Code Status:     Code Status Orders  (From admission, onward)           Start     Ordered   07/31/21 1608  Do not attempt resuscitation (DNR)  Continuous       Question Answer Comment  In the event of cardiac or respiratory ARREST Do not call a "code blue"   In the event of cardiac or respiratory ARREST Do not perform Intubation, CPR, defibrillation or ACLS   In the event of cardiac or respiratory ARREST Use medication by any route, position, wound care, and other measures to relive pain and suffering. May use oxygen, suction and manual treatment of airway obstruction as needed for comfort.   Comments MOST form on chart.      07/31/21 1607           Code Status History     Date Active Date Inactive Code Status Order ID Comments User Context   07/31/2021 1606 07/31/2021 1607 DNR [id number]  Asencion Gowda, NP ED   07/31/2021 0307 07/31/2021 1606 Full Code [id number]  Mansy, Arvella Merles, MD ED   07/23/2021 2134 07/29/2021 2219 Full Code [id number]  Athena Masse, MD ED      Family Communication: Spoke with husband on the phone Disposition Plan: Status is: Inpatient  Dispo: The patient is from: Rehab              Anticipated d/c is to: Hospice facility               Patient currently will continue to need midodrine and amiodarone for now right now.  AICD will be turned off today.  We will get transitional care team to look into hospice facilities.   Difficult to place patient.  No.  Consultants: Cardiology Nephrology Palliative care  Time spent: 27 minutes  [place]

## 2021-08-01 NOTE — ED Notes (Signed)
After pt spoke to a family member (sister in [employer]) she then proceeds to tell this RN "I want to go to [facility] for dialysis" Pt educated she was comfort care and we were arranging transport to hospice toward [place].    Pt states "I want to live a little longer". Palliative NP called to speak to pt regarding frequent changes in how she would like her healthcare wishes.   Pt was again explained in great detail what comfort care/hospice and is the alternative of which is full care. Pt chooses full care. Pt states she also wants pacer to continue to work but ICD shut off.   Pt also states she does not want CPR or intubation.

## 2021-08-01 NOTE — ED Notes (Signed)
This RN was present at bedside with palliative care NP, Crystal, who wanted a witness when discussing of end of life care with this pt.   Pt appears to fully understand what hospice care entails including keeping her comfortable until death comes. Pt understands there will be no life saving measures done including CPR and intubation and pt verbalized understanding of all things discussed.  Pt is fully A&Ox4 and stated she understands everything we discussed with her on the MOLST form and that it was discussed in great detail including all and any interventions that were listed. Each MOLST form box was checked individually and dicussed in great detail and made so pt could fully understand.   Any and all questions were answered for pt and pt still decides hospice is what is best for her to keep her comfortable until end of life happens and that she will no longer need to suffer.

## 2021-08-01 NOTE — Consult Note (Signed)
ANTICOAGULATION CONSULT NOTE - Initial Consult  Pharmacy Consult for warfarin Indication: atrial fibrillation  Allergies  Allergen Reactions   Ciprofloxacin Other (See Comments)   Duricef [Cefadroxil] Hives   Sulfamethoxazole-Trimethoprim Other (See Comments)     Vital Signs: BP: 68/57 (08/12 0930) Pulse Rate: 70 (08/12 0930)  Labs: Recent Labs    07/31/21 0025 07/31/21 0130 07/31/21 0330 07/31/21 0723 08/01/21 0651  HGB 10.4*  --   --  10.3*  --   HCT 35.7*  --   --  36.2  --   PLT 298  --   --  254  --   LABPROT 30.9*  --   --   --  30.7*  INR 3.0*  --   --   --  3.0*  CREATININE  --  4.67*  --  4.68* 3.96*  TROPONINIHS  --  36* 41*  --   --      Estimated Creatinine Clearance: 20.7 mL/min (A) (by C-G formula based on SCr of 3.96 mg/dL (H)).   Medical History: Past Medical History:  Diagnosis Date   CHF (congestive heart failure) ([place])    Diabetes mellitus without complication (HCC)    Paroxysmal atrial fibrillation (HCC)    Renal disorder    Respiratory failure (HCC)    Sleep apnea     Medications:  PTA warfarin regimen per nursing administration guide: 2.5 mg tablet daily (TWD = 17.5 mg) PTA: doxycycline 100 mg BID new outpatient start 07/28/21 - 08/02/21 ; last dose 8/10 @ 2000 07/31/21: New amiodarone IV start   Assessment: 70 y.o. female with a hx of hx of PAF on Coumadin (INR goal 2-3), ESRD on HD TThSat, ICD presented to ED for VT w/ ICD firing. Patient with recent start of outpatient doxycycline for cellulitis. INR 3.0 (therapeutic) on admission  DD-interactions: amiodarone -- new start  doxycycline -- new start 07/28/21 -- planned through 08/02/21  Albumin: 2.6  Date INR Warfarin Dose  8/10 -- 2.5 mg (PTA) 8/11 3.0 1.5 mg  8/12 3.0 1.5 mg   Goal of Therapy:  INR 2-3 Monitor platelets by anticoagulation protocol: Yes   Plan:  INR 3.0 (therapeutic) today  Will empirically reduce warfarin dose by 40% given new amiodarone start and antibiotics  and give 1.5 mg dose x 1 again tonight INR daily CBC at least every 3 days    Darnelle Bos, PharmD Clinical Pharmacist   08/01/2021,10:01 AM

## 2021-08-02 ENCOUNTER — Inpatient Hospital Stay: Payer: Medicare HMO

## 2021-08-02 DIAGNOSIS — R1011 Right upper quadrant pain: Secondary | ICD-10-CM

## 2021-08-02 LAB — GLUCOSE, CAPILLARY
Glucose-Capillary: 105 mg/dL — ABNORMAL HIGH (ref 70–99)
Glucose-Capillary: 134 mg/dL — ABNORMAL HIGH (ref 70–99)
Glucose-Capillary: 128 mg/dL — ABNORMAL HIGH (ref 70–99)
Glucose-Capillary: 115 mg/dL — ABNORMAL HIGH (ref 70–99)

## 2021-08-02 LAB — CBC
HCT: 37.9 % (ref 36.0–46.0)
Hemoglobin: 10.5 g/dL — ABNORMAL LOW (ref 12.0–15.0)
MCH: 26.4 pg (ref 26.0–34.0)
MCHC: 27.7 g/dL — ABNORMAL LOW (ref 30.0–36.0)
MCV: 95.5 fL (ref 80.0–100.0)
Platelets: 234 10*3/uL (ref 150–400)
RBC: 3.97 MIL/uL (ref 3.87–5.11)
RDW: 24.2 % — ABNORMAL HIGH (ref 11.5–15.5)
WBC: 8.6 10*3/uL (ref 4.0–10.5)
nRBC: 0.2 % (ref 0.0–0.2)

## 2021-08-02 LAB — BASIC METABOLIC PANEL
Anion gap: 12 (ref 5–15)
BUN: 26 mg/dL — ABNORMAL HIGH (ref 8–23)
CO2: 26 mmol/L (ref 22–32)
Calcium: 8.8 mg/dL — ABNORMAL LOW (ref 8.9–10.3)
Chloride: 94 mmol/L — ABNORMAL LOW (ref 98–111)
Creatinine, Ser: 4.73 mg/dL — ABNORMAL HIGH (ref 0.44–1.00)
GFR, Estimated: 10 mL/min — ABNORMAL LOW (ref >60)
Glucose, Bld: 138 mg/dL — ABNORMAL HIGH (ref 70–99)
Potassium: 3.7 mmol/L (ref 3.5–5.1)
Sodium: 132 mmol/L — ABNORMAL LOW (ref 135–145)

## 2021-08-02 LAB — PROTIME-INR
INR: 2.5 — ABNORMAL HIGH (ref 0.8–1.2)
Prothrombin Time: 26.7 seconds — ABNORMAL HIGH (ref 11.4–15.2)

## 2021-08-02 MED ORDER — PIPERACILLIN-TAZOBACTAM 3.375 G IVPB
3.3750 g | Freq: Two times a day (BID) | INTRAVENOUS | Status: DC
  Administered 2021-08-02 – 2021-08-03 (×2): 3.375 g via INTRAVENOUS
  Filled 2021-08-02 (×2): qty 50

## 2021-08-02 MED ORDER — WARFARIN SODIUM 2 MG PO TABS
2.0000 mg | ORAL_TABLET | Freq: Once | ORAL | Status: DC
  Filled 2021-08-02: qty 1

## 2021-08-02 MED ORDER — MIDODRINE HCL 5 MG PO TABS
10.0000 mg | ORAL_TABLET | Freq: Once | ORAL | Status: AC
  Administered 2021-08-02: 10 mg via ORAL
  Filled 2021-08-02: qty 2

## 2021-08-02 MED ORDER — OXYCODONE-ACETAMINOPHEN 5-325 MG PO TABS
1.0000 | ORAL_TABLET | Freq: Four times a day (QID) | ORAL | Status: DC | PRN
  Administered 2021-08-03: 1 via ORAL
  Filled 2021-08-02: qty 1

## 2021-08-02 MED ORDER — ALBUMIN HUMAN 25 % IV SOLN
25.0000 g | Freq: Once | INTRAVENOUS | Status: AC
  Administered 2021-08-02: 25 g via INTRAVENOUS
  Filled 2021-08-02: qty 100

## 2021-08-02 NOTE — Consult Note (Signed)
ANTICOAGULATION CONSULT NOTE   Pharmacy Consult for warfarin Indication: atrial fibrillation  Allergies  Allergen Reactions   Ciprofloxacin Other (See Comments)   Duricef [Cefadroxil] Hives   Sulfamethoxazole-Trimethoprim Other (See Comments)     Vital Signs: Temp: 97.8 F (36.6 C) (08/13 0400) Temp Source: Oral (08/13 0400) BP: 62/33 (08/13 0500) Pulse Rate: 67 (08/13 0500)  Labs: Recent Labs    07/31/21 0025 07/31/21 0130 07/31/21 0330 07/31/21 0723 08/01/21 0651 08/02/21 1026  HGB 10.4*  --   --  10.3*  --  10.5*  HCT 35.7*  --   --  36.2  --  37.9  PLT 298  --   --  254  --  234  LABPROT 30.9*  --   --   --  30.7* 26.7*  INR 3.0*  --   --   --  3.0* 2.5*  CREATININE  --  4.67*  --  4.68* 3.96*  --   TROPONINIHS  --  36* 41*  --   --   --      Estimated Creatinine Clearance: 20.6 mL/min (A) (by C-G formula based on SCr of 3.96 mg/dL (H)).   Medical History: Past Medical History:  Diagnosis Date   CHF (congestive heart failure) ([place])    Diabetes mellitus without complication (HCC)    Paroxysmal atrial fibrillation (HCC)    Renal disorder    Respiratory failure (HCC)    Sleep apnea     Medications:  PTA warfarin regimen per nursing administration guide: 2.5 mg tablet daily (TWD = 17.5 mg) PTA: doxycycline 100 mg BID new outpatient start 07/28/21 - 08/02/21 ; last dose 8/10 @ 2000 07/31/21: New amiodarone IV start   Assessment: 70 y.o. female with a hx of hx of PAF on Coumadin (INR goal 2-3), ESRD on HD TThSat, ICD presented to ED for VT w/ ICD firing. Patient with recent start of outpatient doxycycline for cellulitis.   DD-interactions: amiodarone -- new start  doxycycline -- stopped  Albumin: 2.6  Date INR Warfarin Dose  8/10 -- 2.5 mg (PTA) 8/11 3.0 1.5 mg  8/12 3.0 1.5 mg  8/13 2.5 2 mg  Goal of Therapy:  INR 2-3 Monitor platelets by anticoagulation protocol: Yes   Plan:  INR is therapeutic, but trending down. Will give warfarin 2 mg x 1.  Daily INR ordered. CBC at least every 3 days. Pt new start on amio, dose with caution.    Oswald Hillock, PharmD, BCPS Clinical Pharmacist   08/02/2021,10:50 AM

## 2021-08-02 NOTE — Progress Notes (Addendum)
[facility]  ROUNDING NOTE   Subjective:   Linda Quinn is a 70 y.o. female resident of a SNF with past medical conditions including CHF, Atrial fib, diabetes, sleep apnea and ESRD on dialysis. Patient was sent to ED with complaints of shortness of breath and lethargy. There were also reports of AICD firing before ED arrival. She will be admitted for Ventricular tachycardia ([place]) [I47.2] Acute pulmonary edema ([place]) [J81.0] Respiratory distress [R06.03] ESRD (end stage renal disease) on dialysis ([place]) [N18.6, Z99.2] AICD discharge [Z45.02] Hypotension [I95.9]  Patient is known to this practice from previous admissions. She receives dialysis at the [place] [facility] under the [facility] specialist. She was recently admitted earlier this month for abdominal cellulitis, fluid overload and AICD firing. She was discharged after dialysis on Tuesday. While in ED, she has had 2 runs of [facility] resulting in AICD firing.  Patient was seen today in ICU. Patient had changes her mind about hospice/palliative care Patient main complaint in today visit was I wish to go to [facility]    Objective:  Vital signs in last 24 hours:  Temp:  [97.5 F (36.4 C)-97.8 F (36.6 C)] 97.8 F (36.6 C) (08/13 0400) Pulse Rate:  [67-124] 70 (08/13 1530) Resp:  [9-21] 15 (08/13 1530) BP: (33-137)/(19-120) 96/47 (08/13 1530) SpO2:  [49 %-100 %] 99 % (08/13 1530) Weight:  [141 kg] 141 kg (08/12 1756)  Weight change:  Filed Weights   08/01/21 1756  Weight: (!) 141 kg    Intake/Output: No intake/output data recorded.   Intake/Output this shift:  No intake/output data recorded.  Physical Exam: General: NAD, laying in bed  Head: Normocephalic, atraumatic. Moist oral mucosal membranes  Eyes: Anicteric  Lungs:  Diminished, normal effort  Heart: Regular rate and rhythm  Abdomen:  Soft, tenderness  Extremities:  1+ peripheral edema.  Neurologic: Nonfocal, moving all four extremities  Skin: No  lesions  Access: Rt Permcath    Basic Metabolic Panel: Recent Labs  Lab 07/27/21 0904 07/31/21 0130 07/31/21 0723 08/01/21 0651 08/02/21 1026  NA 135 130* 132* 133* 132*  K 3.4* 4.1 4.1 3.7 3.7  CL 95* 92* 93* 95* 94*  CO2 '28 23 25 26 26  '$ GLUCOSE 132* 159* 156* 138* 138*  BUN 13 26* 27* 21 26*  CREATININE 3.06* 4.67* 4.68* 3.96* 4.73*  CALCIUM 8.3* 8.5* 8.6* 8.6* 8.8*  MG  --  2.4  --   --   --     Liver Function Tests: Recent Labs  Lab 07/31/21 0130  AST 16  ALT 10  ALKPHOS 127*  BILITOT 1.8*  PROT 7.1  ALBUMIN 2.6*   No results for input(s): LIPASE, AMYLASE in the last 168 hours. No results for input(s): AMMONIA in the last 168 hours.  CBC: Recent Labs  Lab 07/27/21 0904 07/31/21 0025 07/31/21 0723 08/02/21 1026  WBC 9.5 9.0 8.1 8.6  NEUTROABS  --  6.7  --   --   HGB 9.8* 10.4* 10.3* 10.5*  HCT 34.1* 35.7* 36.2 37.9  MCV 91.9 94.4 91.9 95.5  PLT 242 298 254 234    Cardiac Enzymes: No results for input(s): CKTOTAL, CKMB, CKMBINDEX, TROPONINI in the last 168 hours.  BNP: Invalid input(s): POCBNP  CBG: Recent Labs  Lab 08/01/21 1015 08/01/21 1801 08/01/21 2100 08/02/21 0731 08/02/21 1149  GLUCAP 151* 175* 142* 105* 134*    Microbiology: Results for orders placed or performed during the hospital encounter of 07/31/21  Resp Panel by RT-PCR (Flu  A&B, Covid) Nasopharyngeal Swab     Status: None   Collection Time: 07/31/21 12:15 AM   Specimen: Nasopharyngeal Swab; Nasopharyngeal(NP) swabs in vial transport medium  Result Value Ref Range Status   SARS Coronavirus 2 by RT PCR NEGATIVE NEGATIVE Final    Comment: (NOTE) SARS-CoV-2 target nucleic acids are NOT DETECTED.  The SARS-CoV-2 RNA is generally detectable in upper respiratory specimens during the acute phase of infection. The lowest concentration of SARS-CoV-2 viral copies this assay can detect is 138 copies/mL. A negative result does not preclude SARS-Cov-2 infection and should not be  used as the sole basis for treatment or other patient management decisions. A negative result may occur with  improper specimen collection/handling, submission of specimen other than nasopharyngeal swab, presence of viral mutation(s) within the areas targeted by this assay, and inadequate number of viral copies(<138 copies/mL). A negative result must be combined with clinical observations, patient history, and epidemiological information. The expected result is Negative.  Fact Sheet for Patients:  EntrepreneurPulse.com.au  Fact Sheet for Healthcare Providers:  IncredibleEmployment.be  This test is no t yet approved or cleared by the [place] FDA and  has been authorized for detection and/or diagnosis of SARS-CoV-2 by FDA under an Emergency Use Authorization (EUA). This EUA will remain  in effect (meaning this test can be used) for the duration of the COVID-19 declaration under Section 564(b)(1) of the Act, 21 U.S.C.section 360bbb-3(b)(1), unless the authorization is terminated  or revoked sooner.       Influenza A by PCR NEGATIVE NEGATIVE Final   Influenza B by PCR NEGATIVE NEGATIVE Final    Comment: (NOTE) The Xpert Xpress SARS-CoV-2/FLU/RSV plus assay is intended as an aid in the diagnosis of influenza from Nasopharyngeal swab specimens and should not be used as a sole basis for treatment. Nasal washings and aspirates are unacceptable for Xpert Xpress SARS-CoV-2/FLU/RSV testing.  Fact Sheet for Patients: EntrepreneurPulse.com.au  Fact Sheet for Healthcare Providers: IncredibleEmployment.be  This test is not yet approved or cleared by the [place] FDA and has been authorized for detection and/or diagnosis of SARS-CoV-2 by FDA under an Emergency Use Authorization (EUA). This EUA will remain in effect (meaning this test can be used) for the duration of the COVID-19 declaration under Section  564(b)(1) of the Act, 21 U.S.C. section 360bbb-3(b)(1), unless the authorization is terminated or revoked.  Performed at [facility], [address] [place], [place] [address]   MRSA Next Gen by PCR, Nasal     Status: None   Collection Time: 08/01/21  6:00 PM   Specimen: Nasal Mucosa; Nasal Swab  Result Value Ref Range Status   MRSA by PCR Next Gen NOT DETECTED NOT DETECTED Final    Comment: (NOTE) The GeneXpert MRSA Assay (FDA approved for NASAL specimens only), is one component of a comprehensive MRSA colonization surveillance program. It is not intended to diagnose MRSA infection nor to guide or monitor treatment for MRSA infections. Test performance is not FDA approved in patients less than 68 years old. Performed at [facility], [address] [place], [place] [address]     Coagulation Studies: Recent Labs    07/31/21 0025 08/01/21 0651 08/02/21 1026  LABPROT 30.9* 30.7* 26.7*  INR 3.0* 3.0* 2.5*    Urinalysis: No results for input(s): COLORURINE, LABSPEC, PHURINE, GLUCOSEU, HGBUR, BILIRUBINUR, KETONESUR, PROTEINUR, UROBILINOGEN, NITRITE, LEUKOCYTESUR in the last 72 hours.  Invalid input(s): APPERANCEUR    Imaging: US Abdomen Limited RUQ (LIVER/GB)  Result Date: 08/02/2021  CLINICAL DATA:  Abdominal pain for 24 hours EXAM: ULTRASOUND ABDOMEN LIMITED RIGHT UPPER QUADRANT COMPARISON:  None. FINDINGS: Gallbladder: Difficult exam due to patient discomfort. Patient experienced significant pain with probe touching skin. Gallbladder wall is mildly thickened at 4 mm. 10 mm gallstone within nondistended gallbladder. Potential sonographic Murphy's sign with diffuse tenderness over the abdomen. Common bile duct: Diameter: Normal at 3 mm. Liver: No focal lesion identified. Within normal limits in parenchymal echogenicity. Portal vein is patent on color Doppler imaging with normal direction of blood flow towards the liver. Other: None. IMPRESSION: 1.  Gallbladder wall thickening and gallstone without gallbladder distension. Equivocal Murphy's sign with patient extremely tender to touch over the entirety of the abdomen. 2. Common bile duct normal caliber. 3. Consider nuclear medicine HIDA scan if continued concern for acute cholecystitis. Electronically Signed   By: Suzy Bouchard M.D.   On: 08/02/2021 13:01     Medications:    sodium chloride     sodium chloride     amiodarone 30 mg/hr (08/02/21 1402)   piperacillin-tazobactam (ZOSYN)  IV      calcium carbonate  2 tablet Oral BID   Chlorhexidine Gluconate Cloth  6 each Topical Q0600   Chlorhexidine Gluconate Cloth  6 each Topical Q0600   gabapentin  100 mg Oral BID   insulin aspart  0-5 Units Subcutaneous QHS   insulin aspart  0-6 Units Subcutaneous TID WC   loratadine  10 mg Oral Daily   melatonin  2.5 mg Oral QHS   midodrine  10 mg Oral QID   senna-docusate  2 tablet Oral QHS   warfarin  2 mg Oral ONCE-1600   Warfarin - Pharmacist Dosing Inpatient   Does not apply q1600   sodium chloride, sodium chloride, acetaminophen **OR** acetaminophen, alteplase, heparin, lidocaine (PF), lidocaine-prilocaine, [DISCONTINUED] ondansetron **OR** ondansetron (ZOFRAN) IV, ondansetron, oxyCODONE-acetaminophen, pentafluoroprop-tetrafluoroeth, traZODone  Assessment/ Plan:  Ms. Yolette Belinski is a 70 y.o.  female  with diabetes, Atrial Fib on Coumadin, anemia, pacemaker/AICD, and ESRD on HD, who was admitted to [facility] on 07/23/2021 for Ventricular tachycardia ([place]) [I47.2] Acute pulmonary edema ([place]) [J81.0] Respiratory distress [R06.03] ESRD (end stage renal disease) on dialysis ([place]) [N18.6, Z99.2] AICD discharge [Z45.02] Hypotension [I95.9]   [facility] [address]/TTS/Rt Permcath  Renal Patient has end-stage renal disease Patient is on hemodialysis Patient is on Tuesday Thursday Saturday schedule We will dialyze patient today   2. Anemia of chronic kidney disease Lab Results  Component  Value Date   HGB 10.5 (L) 08/02/2021   Hgb at acceptable range Will monitor labs  3. Secondary Hyperparathyroidism: Lab Results  Component Value Date   CALCIUM 8.8 (L) 08/02/2021   Calcium not at target Calcium carbonate BID  4.  Hypotension Patient is on midodrine  Impression We will dialyze patient today As patient blood pressure on the lower side will try step to the rest profile and lower dialysate temperature    LOS: 2 Kenslei Hearty s [facility] 8/13/20223:46 PM

## 2021-08-02 NOTE — Progress Notes (Signed)
Arrived at bedside, patient was sleepy a and a bit lethargic but able to wake up and answer questions, alert and oriented but very hypotensive. Denies symptoms. Spoke to Dr. Theador Hawthorne and obtained parameters to maintain systolic BP of [id number] before initiating and during hD.

## 2021-08-02 NOTE — Progress Notes (Signed)
Patient ID: Linda Quinn, female   DOB: April 19, 1951, 70 y.o.   MRN: [id number] [facility] PROGRESS NOTE  Linda Quinn [id number] DOB: [birth year] DOA: 07/31/2021 PCP: Townsend Roger, MD  HPI/Subjective: Patient complains of some right upper quadrant pain.  Pain even to light touch.  No nausea or vomiting.  Objective: Vitals:   08/02/21 0500 08/02/21 1100  BP: (!) 62/33 105/83  Pulse: 67 83  Resp: 17   Temp:    SpO2: (!) 88% 92%   No intake or output data in the 24 hours ending 08/02/21 1232 Filed Weights   08/01/21 1756  Weight: (!) 141 kg    ROS: Review of Systems  Respiratory:  Negative for shortness of breath.   Cardiovascular:  Negative for chest pain.  Gastrointestinal:  Positive for abdominal pain. Negative for nausea and vomiting.  Exam: Physical Exam HENT:     Head: Normocephalic.     Mouth/Throat:     Pharynx: No oropharyngeal exudate.  Eyes:     General: Lids are normal.     Conjunctiva/sclera: Conjunctivae normal.  Cardiovascular:     Rate and Rhythm: Normal rate and regular rhythm.     Heart sounds: Normal heart sounds, S1 normal and S2 normal.  Pulmonary:     Breath sounds: Examination of the right-lower field reveals decreased breath sounds. Examination of the left-lower field reveals decreased breath sounds. Decreased breath sounds present. No wheezing, rhonchi or rales.  Abdominal:     Palpations: Abdomen is soft.     Tenderness: There is abdominal tenderness in the right upper quadrant.  Musculoskeletal:     Right ankle: Swelling present.     Left ankle: Swelling present.  Skin:    General: Skin is warm.     Comments: Darkened skin discoloration right lower abdomen.  No rash seen in the right upper quadrant.  Neurological:     Mental Status: She is alert.     Comments: Answer some yes/no questions.      Scheduled Meds:  calcium carbonate  2 tablet Oral BID   Chlorhexidine Gluconate Cloth  6 each Topical Q0600   Chlorhexidine  Gluconate Cloth  6 each Topical Q0600   gabapentin  100 mg Oral BID   insulin aspart  0-5 Units Subcutaneous QHS   insulin aspart  0-6 Units Subcutaneous TID WC   loratadine  10 mg Oral Daily   melatonin  2.5 mg Oral QHS   midodrine  10 mg Oral QID   senna-docusate  2 tablet Oral QHS   warfarin  2 mg Oral ONCE-1600   Warfarin - Pharmacist Dosing Inpatient   Does not apply q1600   Continuous Infusions:  sodium chloride     sodium chloride     amiodarone 30 mg/hr (08/02/21 0725)    Assessment/Plan:  Persistent hypotension.  Continue midodrine 10 mg 4 times a day.  Can use pressors if needed. AICD firing and ventricular tachycardia.  Again limited with medication secondary to hypotension.  Currently on amiodarone drip.  Patient was accepted to [facility] but currently does not have a bed.  AICD was not turned off since she declined comfort care.  Updated cardiology that she changed her mind about comfort care. Right upper quadrant pain even to light touch.  I will get a right upper quadrant sonogram to rule out cholecystitis. Acute on chronic systolic congestive heart failure with anasarca.  EF less than 20%.  Limited with medication secondary to hypotension. End-stage renal disease.  Dialysis today.  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  Sliding scale insulin for now. 3 stage II decubiti present on admission, buttock, left posterior leg and right posterior leg Acute hypoxic respiratory failure.  This morning on 3 L bubble high flow nasal cannula.  Patient did have a pulse ox of 88% even on oxygen. Patient palliative care consultation.  Patient is a DNR.  Pressure Injury 07/23/21 Ankle Left;Posterior Stage 2 -  Partial thickness loss of dermis presenting as a shallow open injury with a red, pink wound bed without slough. epithelialized (Active)  07/23/21 2315  Location: Ankle  Location Orientation: Left;Posterior  Staging: Stage 2 -  Partial thickness loss of  dermis presenting as a shallow open injury with a red, pink wound bed without slough.  Wound Description (Comments): epithelialized  Present on Admission: Yes     Pressure Injury Leg Right;Posterior Stage 2 -  Partial thickness loss of dermis presenting as a shallow open injury with a red, pink wound bed without slough. epithelialized (Active)     Location: Leg  Location Orientation: Right;Posterior  Staging: Stage 2 -  Partial thickness loss of dermis presenting as a shallow open injury with a red, pink wound bed without slough.  Wound Description (Comments): epithelialized  Present on Admission: Yes       Code Status:     Code Status Orders  (From admission, onward)           Start     Ordered   07/31/21 1608  Do not attempt resuscitation (DNR)  Continuous       Question Answer Comment  In the event of cardiac or respiratory ARREST Do not call a "code blue"   In the event of cardiac or respiratory ARREST Do not perform Intubation, CPR, defibrillation or ACLS   In the event of cardiac or respiratory ARREST Use medication by any route, position, wound care, and other measures to relive pain and suffering. May use oxygen, suction and manual treatment of airway obstruction as needed for comfort.   Comments MOST form on chart.      07/31/21 1607           Code Status History     Date Active Date Inactive Code Status Order ID Comments User Context   07/31/2021 1606 07/31/2021 1607 DNR [id number]  Asencion Gowda, NP ED   07/31/2021 0307 07/31/2021 1606 Full Code [id number]  Mansy, Arvella Merles, MD ED   07/23/2021 2134 07/29/2021 2219 Full Code [id number]  Athena Masse, MD ED      Family Communication: Tried calling family on the phone but was disconnected Disposition Plan: Status is: Inpatient  Dispo: The patient is from: Rehab              Anticipated d/c is to: [facility] when bed available              Patient currently with hypotension and poor EF.  Limited with medications at this  point.   Difficult to place patient.  No.  Consultants: Cardiology Nephrology Palliative care  Time spent: 27 minutes  [place]

## 2021-08-02 NOTE — Progress Notes (Signed)
This am patient on end of shift rounds, patient stated that she couldn't sleep all night due to a pain in her right upper quadrant. Mass noted under skin. MD will be notified. Will continue to monitor.

## 2021-08-02 NOTE — Progress Notes (Signed)
This patient had 3.5 hours of HD today. Uf goal was initially ordered for 2L net but due to hypotension pre Hd and drops during HD. The UF goal order was changed by Dr. Theador Hawthorne to 1L net. The new UF goal was achieved as ordered. Post Rinse back, the patient's vital signs are stable.  Cathter site is benign, limbs are locked with heparin. Handoff report has been given to the ICU Care RN.

## 2021-08-02 NOTE — Progress Notes (Signed)
Pharmacy Antibiotic Note  Linda Quinn is a 70 y.o. female admitted on 07/31/2021 with  intra-abdominal infection , right upper quadrant pain. Pharmacy has been consulted for piperacillin/tazobactam dosing.  WBC WNL, patient is afebrile. Documented allergy to cefadroxil (hives) in chart, however has tolerated Augmentin in the past, per notes.   Plan: Initiate Zosyn 3.375g IV q12hr (4 hour infusion) due to CrCl<20. Monitor Scr daily, WBC and temperature.  Height: '5\' 8"'$  (172.7 cm) Weight: (!) 141 kg (310 lb 13.6 oz) IBW/kg (Calculated) : 63.9  Temp (24hrs), Avg:97.7 F (36.5 C), Min:97.5 F (36.4 C), Max:97.8 F (36.6 C)  Recent Labs  Lab 07/27/21 0904 07/31/21 0025 07/31/21 0130 07/31/21 0723 08/01/21 0651 08/01/21 1531 08/01/21 1858 08/02/21 1026  WBC 9.5 9.0  --  8.1  --   --   --  8.6  CREATININE 3.06*  --  4.67* 4.68* 3.96*  --   --  4.73*  LATICACIDVEN  --   --   --   --   --  2.7* 2.7*  --     Estimated Creatinine Clearance: 17.3 mL/min (A) (by C-G formula based on SCr of 4.73 mg/dL (H)).    Allergies  Allergen Reactions   Ciprofloxacin Other (See Comments)   Duricef [Cefadroxil] Hives   Sulfamethoxazole-Trimethoprim Other (See Comments)    Antimicrobials this admission: 8/13 piperacillin/tazobactam >>   Dose adjustments this admission: None  Microbiology results: None   Thank you for allowing pharmacy to be a part of this patient's care.   Wynelle Cleveland, PharmD Pharmacy Resident  08/02/2021 2:46 PM

## 2021-08-02 NOTE — Progress Notes (Signed)
Patient is resting well in bed at this time. BP is low and has been for the greater portion of the shift. Hospitalist was notified and amiodarone drip stopped. Ordered a 274m bolus. It was mildly effective with her BP dropping again with MAPs in the 30s and 40s. MD once again notified and ordered midodrone. Given to patient as ordered but not much change noted. BP continues to be low. Amiodarone was restarted per order. Patient has a magnet to her left chest to deactivate her pacemaker. [facility] Patient is asymptomatic of hypotension and states that she runs that way all the time. She is also impulsive. Call bell in reach. Will continue to monitor.

## 2021-08-02 NOTE — Progress Notes (Addendum)
Notified that patient is going to be transferred to [facility] and no longer comfort care. Spoke with rounding MD. Chart reviewed. 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 If here tomorrow AM, we will recheck and provide additional recommendations, if needed. Overall prognosis poor, as previously noted.

## 2021-08-02 NOTE — Progress Notes (Signed)
Patient ID: Linda Quinn, female   DOB: 1951-12-06, 70 y.o.   MRN: [id number]  Ultrasound of the abdomen did show some gallbladder wall thickening with gallstones without gallbladder distention.  Equivocal Murphy sign with patient being equivocally tender over the right upper abdomen.  We will start empiric Zosyn for right now.  Will consider HIDA scan on Monday.  Not a good surgical candidate if this is the gallbladder.  Dr. Loletha Grayer

## 2021-08-03 DIAGNOSIS — J9601 Acute respiratory failure with hypoxia: Secondary | ICD-10-CM

## 2021-08-03 LAB — BASIC METABOLIC PANEL
Anion gap: 13 (ref 5–15)
BUN: 18 mg/dL (ref 8–23)
CO2: 25 mmol/L (ref 22–32)
Calcium: 8.7 mg/dL — ABNORMAL LOW (ref 8.9–10.3)
Chloride: 97 mmol/L — ABNORMAL LOW (ref 98–111)
Creatinine, Ser: 3.79 mg/dL — ABNORMAL HIGH (ref 0.44–1.00)
GFR, Estimated: 12 mL/min — ABNORMAL LOW (ref >60)
Glucose, Bld: 113 mg/dL — ABNORMAL HIGH (ref 70–99)
Potassium: 3.6 mmol/L (ref 3.5–5.1)
Sodium: 135 mmol/L (ref 135–145)

## 2021-08-03 LAB — GLUCOSE, CAPILLARY
Glucose-Capillary: 119 mg/dL — ABNORMAL HIGH (ref 70–99)
Glucose-Capillary: 165 mg/dL — ABNORMAL HIGH (ref 70–99)
Glucose-Capillary: 129 mg/dL — ABNORMAL HIGH (ref 70–99)
Glucose-Capillary: 176 mg/dL — ABNORMAL HIGH (ref 70–99)

## 2021-08-03 LAB — PROTIME-INR
INR: 2.1 — ABNORMAL HIGH (ref 0.8–1.2)
Prothrombin Time: 23.6 seconds — ABNORMAL HIGH (ref 11.4–15.2)

## 2021-08-03 MED ORDER — WARFARIN SODIUM 3 MG PO TABS
3.0000 mg | ORAL_TABLET | Freq: Once | ORAL | Status: AC
  Administered 2021-08-03: 3 mg via ORAL
  Filled 2021-08-03: qty 1

## 2021-08-03 MED ORDER — GABAPENTIN 100 MG PO CAPS
100.0000 mg | ORAL_CAPSULE | Freq: Three times a day (TID) | ORAL | Status: DC
  Administered 2021-08-03 – 2021-08-04 (×3): 100 mg via ORAL
  Filled 2021-08-03 (×3): qty 1

## 2021-08-03 MED ORDER — PENTAFLUOROPROP-TETRAFLUOROETH EX AERO: 1.0000 "application " | INHALATION_SPRAY | CUTANEOUS | 0 refills | Status: AC | PRN

## 2021-08-03 MED ORDER — WARFARIN SODIUM 3 MG PO TABS: 3.0000 mg | ORAL_TABLET | Freq: Once | ORAL | Status: AC

## 2021-08-03 MED ORDER — PIPERACILLIN-TAZOBACTAM 3.375 G IVPB: 3.3750 g | Freq: Three times a day (TID) | INTRAVENOUS | Status: DC

## 2021-08-03 MED ORDER — LIDOCAINE-PRILOCAINE 2.5-2.5 % EX CREA: 1.0000 "application " | TOPICAL_CREAM | CUTANEOUS | 0 refills | Status: AC | PRN

## 2021-08-03 MED ORDER — GABAPENTIN 100 MG PO CAPS: 100.0000 mg | ORAL_CAPSULE | Freq: Three times a day (TID) | ORAL | Status: AC

## 2021-08-03 MED ORDER — CHLORHEXIDINE GLUCONATE CLOTH 2 % EX PADS: 6.0000 | MEDICATED_PAD | Freq: Every day | CUTANEOUS | Status: AC

## 2021-08-03 MED ORDER — PIPERACILLIN-TAZOBACTAM 3.375 G IVPB
3.3750 g | Freq: Three times a day (TID) | INTRAVENOUS | Status: DC
  Administered 2021-08-03 – 2021-08-04 (×3): 3.375 g via INTRAVENOUS
  Filled 2021-08-03 (×3): qty 50

## 2021-08-03 NOTE — Progress Notes (Signed)
Patient ID: Linda Quinn, female   DOB: 1951/08/10, 70 y.o.   MRN: [id number]  Spoke with the patients husband earlier today and he wants to be the only contact for the patient.  Nursing staff notified med that patient wanted to change her code status.  Spoke with patients and she would like to change her code status to partial code. She does not want a breathing machine or tube but okay to do CPR.  The patient stated we can speak with her neices.  Will change code status.  Dr Leslye Peer

## 2021-08-03 NOTE — Discharge Summary (Addendum)
[facility] [place] NAME: Linda Quinn    MR#:  [id number]  DATE OF BIRTH:  [birth year]  DATE OF ADMISSION:  07/31/2021 ADMITTING PHYSICIAN: Loletha Grayer, MD  DATE OF DISCHARGE: 08/03/2021  PRIMARY CARE PHYSICIAN: Townsend Roger, MD    ADMISSION DIAGNOSIS:  Ventricular tachycardia ([place]) [I47.2] Acute pulmonary edema (HCC) [J81.0] Respiratory distress [R06.03] ESRD (end stage renal disease) on dialysis ([place]) [N18.6, Z99.2] AICD discharge [Z45.02] Hypotension [I95.9]  DISCHARGE DIAGNOSIS:  Active Problems:   Type 2 diabetes mellitus with diabetic neuropathy, with long-term current use of insulin (HCC)   ESRD (end stage renal disease) on dialysis (HCC)   Hypotension   AICD discharge   Right upper quadrant abdominal pain   SECONDARY DIAGNOSIS:   Past Medical History:  Diagnosis Date   CHF (congestive heart failure) (HCC)    Diabetes mellitus without complication (HCC)    Paroxysmal atrial fibrillation ([place])    Renal disorder    Respiratory failure ([place])    Sleep apnea     HOSPITAL COURSE:   Persistent hypotension.  Continue midodrine 10 mg 4 times a day.  Can use pressors if needed with dialysis. AICD firing and ventricular tachycardia.  Limited with medication secondary to hypotension.  Currently on amiodarone drip.  Patient accepted to [facility] but currently does not have a bed.  The AICD was not turned off since she declined comfort care. Right upper quadrant pain to light touch.  Previous CT scan of the abdomen did show subcutaneous edema consistent with anasarca.  Right upper quadrant ultrasound showed gallbladder wall thickening and gallstones without gallbladder distention.  I did start on empiric Zosyn just in case this is gallbladder cause.  Since her pain is to the light touch I believe it is likely nerve related pain.  I do not see a rash so this is less likely shingles.  Increase gabapentin to 3 times daily dosing.  Can  consider a HIDA scan. Acute on chronic systolic congestive heart failure with anasarca.  EF less than 20% on recent echocardiogram.  Limited with medication secondary to hypotension.  Dialysis is the only way to remove fluid. End-stage renal disease had dialysis on Thursday evening and Saturday. 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  Increasing the dose to 3 times daily dosing.  Sliding scale insulin for now.  Can go back on Lantus insulin. 3 stage II decubiti, present on admission on buttock, left ankle and right posterior leg. Acute hypoxic respiratory failure.  This morning again on 3 L bubble high flow nasal cannula.  Continue oxygen supplementation. Appreciate palliative care consultation Patient is a DNR Paroxysmal atrial fibrillation.  Pharmacy dosing Coumadin.  DISCHARGE CONDITIONS:   Fair  CONSULTS OBTAINED:  Treatment Team:  Murlean Iba, MD Vickie Epley, MD  DRUG ALLERGIES:   Allergies  Allergen Reactions   Ciprofloxacin Other (See Comments)   Duricef [Cefadroxil] Hives   Sulfamethoxazole-Trimethoprim Other (See Comments)    DISCHARGE MEDICATIONS:   Allergies as of 08/03/2021       Reactions   Ciprofloxacin Other (See Comments)   Duricef [cefadroxil] Hives   Sulfamethoxazole-trimethoprim Other (See Comments)        Medication List     STOP taking these medications    doxycycline 100 MG tablet Commonly known as: VIBRA-TABS       TAKE these medications    acetaminophen 325 MG tablet Commonly known as: TYLENOL Take 2 tablets (650 mg total)  by mouth every 6 (six) hours as needed for mild pain (or Fever >/= 101).   amiodarone 360-4.14 MG/200ML-% Soln Commonly known as: NEXTERONE PREMIX Inject 30 mg/hr into the vein continuous.   aspirin EC 81 MG tablet Take 81 mg by mouth daily. Swallow whole.   calcitRIOL 0.5 MCG capsule Commonly known as: ROCALTROL Take 0.5 mcg by mouth daily.   calcium carbonate 500 MG  chewable tablet Commonly known as: TUMS - dosed in mg elemental calcium Chew 2 tablets (400 mg of elemental calcium total) by mouth 2 (two) times daily.   Chlorhexidine Gluconate Cloth 2 % Pads Apply 6 each topically daily at 6 (six) AM. Start taking on: August 04, 2021   gabapentin 100 MG capsule Commonly known as: NEURONTIN Take 1 capsule (100 mg total) by mouth 3 (three) times daily. What changed: when to take this   insulin glargine 100 UNIT/ML injection Commonly known as: LANTUS Inject 10 Units into the skin daily.   insulin lispro 100 UNIT/ML KwikPen Commonly known as: HUMALOG Inject 0-10 Units into the skin 3 (three) times daily.   lidocaine 5 % Commonly known as: LIDODERM Place 1 patch onto the skin daily. Remove & Discard patch within 12 hours or as directed by MD   lidocaine-prilocaine cream Commonly known as: EMLA Apply 1 application topically as needed (topical anesthesia for hemodialysis if Gebauers and Lidocaine injection are ineffective.).   loratadine 10 MG tablet Commonly known as: CLARITIN Take 10 mg by mouth daily.   melatonin 3 MG Tabs tablet Take 3 mg by mouth at bedtime.   midodrine 10 MG tablet Commonly known as: PROAMATINE Take 1 tablet (10 mg total) by mouth 4 (four) times daily. What changed: when to take this   pentafluoroprop-tetrafluoroeth Aero Commonly known as: GEBAUERS Apply 1 application topically as needed (topical anesthesia for hemodialysis).   piperacillin-tazobactam 3.375 GM/50ML IVPB Commonly known as: ZOSYN Inject 50 mLs (3.375 g total) into the vein every 8 (eight) hours.   polyethylene glycol 17 g packet Commonly known as: MIRALAX / GLYCOLAX Take 17 g by mouth daily.   senna-docusate 8.6-50 MG tablet Commonly known as: Senokot-S Take 2 tablets by mouth at bedtime.   vitamin C 500 MG tablet Commonly known as: ASCORBIC ACID Take 500 mg by mouth daily.   warfarin 3 MG tablet Commonly known as: COUMADIN Take 1 tablet  (3 mg total) by mouth one time only at 4 PM. What changed:  medication strength how much to take when to take this         DISCHARGE INSTRUCTIONS:   Follow-up with team at [facility]  If you experience worsening of your admission symptoms, develop shortness of breath, life threatening emergency, suicidal or homicidal thoughts you must seek medical attention immediately by calling 911 or calling your MD immediately  if symptoms less severe.  You Must read complete instructions/literature along with all the possible adverse reactions/side effects for all the Medicines you take and that have been prescribed to you. Take any new Medicines after you have completely understood and accept all the possible adverse reactions/side effects.   Please note  You were cared for by a hospitalist during your hospital stay. If you have any questions about your discharge medications or the care you received while you were in the hospital after you are discharged, you can call the unit and asked to speak with the hospitalist on call if the hospitalist that took care of you is not available. Once you are  discharged, your primary care physician will handle any further medical issues. Please note that NO REFILLS for any discharge medications will be authorized once you are discharged, as it is imperative that you return to your primary care physician (or establish a relationship with a primary care physician if you do not have one) for your aftercare needs so that they can reassess your need for medications and monitor your lab values.    Today   CHIEF COMPLAINT:   Chief Complaint  Patient presents with   Shortness of Breath    HISTORY OF PRESENT ILLNESS:  Linda Quinn  is a 70 y.o. female came back to the hospital with shortness of breath and AICD firing.   VITAL SIGNS:  Blood pressure 116/73, pulse 70, temperature 98 F (36.7 C), temperature source Oral, resp. rate 15, height '5\' 8"'$  (1.727 m), weight  (!) 141 kg, SpO2 94 %.  I/O:   Intake/Output Summary (Last 24 hours) at 08/03/2021 1116 Last data filed at 08/02/2021 1900 Gross per 24 hour  Intake --  Output 1000 ml  Net -1000 ml    PHYSICAL EXAMINATION:  GENERAL:  70 y.o.-year-old patient lying in the bed with no acute distress.  EYES: Pupils equal, round, reactive to light and accommodation. No scleral icterus.  HEENT: Head atraumatic, normocephalic. Oropharynx and nasopharynx clear.  LUNGS: Normal breath sounds bilaterally, no wheezing, rales,rhonchi or crepitation. No use of accessory muscles of respiration.  CARDIOVASCULAR: S1, S2 normal. No murmurs, rubs, or gallops.  ABDOMEN: Soft, distended. Bowel sounds present. No organomegaly or mass.  Pain to light touch over the right upper quadrant. EXTREMITIES: 3+ pedal edema  NEUROLOGIC: Cranial nerves II through XII are intact.  PSYCHIATRIC: The patient is alert and answers questions appropriately.  SKIN: No rash seen in right upper quadrant.  Lower abdominal open ulcer.  DATA REVIEW:   CBC Recent Labs  Lab 08/02/21 1026  WBC 8.6  HGB 10.5*  HCT 37.9  PLT 234    Chemistries  Recent Labs  Lab 07/31/21 0130 07/31/21 0723 08/03/21 0454  NA 130*   < > 135  K 4.1   < > 3.6  CL 92*   < > 97*  CO2 23   < > 25  GLUCOSE 159*   < > 113*  BUN 26*   < > 18  CREATININE 4.67*   < > 3.79*  CALCIUM 8.5*   < > 8.7*  MG 2.4  --   --   AST 16  --   --   ALT 10  --   --   ALKPHOS 127*  --   --   BILITOT 1.8*  --   --    < > = values in this interval not displayed.     Microbiology Results  Results for orders placed or performed during the hospital encounter of 07/31/21  Resp Panel by RT-PCR (Flu A&B, Covid) Nasopharyngeal Swab     Status: None   Collection Time: 07/31/21 12:15 AM   Specimen: Nasopharyngeal Swab; Nasopharyngeal(NP) swabs in vial transport medium  Result Value Ref Range Status   SARS Coronavirus 2 by RT PCR NEGATIVE NEGATIVE Final    Comment:  (NOTE) SARS-CoV-2 target nucleic acids are NOT DETECTED.  The SARS-CoV-2 RNA is generally detectable in upper respiratory specimens during the acute phase of infection. The lowest concentration of SARS-CoV-2 viral copies this assay can detect is 138 copies/mL. A negative result does not preclude SARS-Cov-2 infection and should not be  used as the sole basis for treatment or other patient management decisions. A negative result may occur with  improper specimen collection/handling, submission of specimen other than nasopharyngeal swab, presence of viral mutation(s) within the areas targeted by this assay, and inadequate number of viral copies(<138 copies/mL). A negative result must be combined with clinical observations, patient history, and epidemiological information. The expected result is Negative.  Fact Sheet for Patients:  EntrepreneurPulse.com.au  Fact Sheet for Healthcare Providers:  IncredibleEmployment.be  This test is no t yet approved or cleared by the [place] FDA and  has been authorized for detection and/or diagnosis of SARS-CoV-2 by FDA under an Emergency Use Authorization (EUA). This EUA will remain  in effect (meaning this test can be used) for the duration of the COVID-19 declaration under Section 564(b)(1) of the Act, 21 U.S.C.section 360bbb-3(b)(1), unless the authorization is terminated  or revoked sooner.       Influenza A by PCR NEGATIVE NEGATIVE Final   Influenza B by PCR NEGATIVE NEGATIVE Final    Comment: (NOTE) The Xpert Xpress SARS-CoV-2/FLU/RSV plus assay is intended as an aid in the diagnosis of influenza from Nasopharyngeal swab specimens and should not be used as a sole basis for treatment. Nasal washings and aspirates are unacceptable for Xpert Xpress SARS-CoV-2/FLU/RSV testing.  Fact Sheet for Patients: EntrepreneurPulse.com.au  Fact Sheet for Healthcare  Providers: IncredibleEmployment.be  This test is not yet approved or cleared by the [place] FDA and has been authorized for detection and/or diagnosis of SARS-CoV-2 by FDA under an Emergency Use Authorization (EUA). This EUA will remain in effect (meaning this test can be used) for the duration of the COVID-19 declaration under Section 564(b)(1) of the Act, 21 U.S.C. section 360bbb-3(b)(1), unless the authorization is terminated or revoked.  Performed at [facility], [address] [place], [place] [address]   MRSA Next Gen by PCR, Nasal     Status: None   Collection Time: 08/01/21  6:00 PM   Specimen: Nasal Mucosa; Nasal Swab  Result Value Ref Range Status   MRSA by PCR Next Gen NOT DETECTED NOT DETECTED Final    Comment: (NOTE) The GeneXpert MRSA Assay (FDA approved for NASAL specimens only), is one component of a comprehensive MRSA colonization surveillance program. It is not intended to diagnose MRSA infection nor to guide or monitor treatment for MRSA infections. Test performance is not FDA approved in patients less than 33 years old. Performed at [facility], [address] [place], [place] [address]     RADIOLOGY:  US Abdomen Limited RUQ (LIVER/GB)  Result Date: 08/02/2021 CLINICAL DATA:  Abdominal pain for 24 hours EXAM: ULTRASOUND ABDOMEN LIMITED RIGHT UPPER QUADRANT COMPARISON:  None. FINDINGS: Gallbladder: Difficult exam due to patient discomfort. Patient experienced significant pain with probe touching skin. Gallbladder wall is mildly thickened at 4 mm. 10 mm gallstone within nondistended gallbladder. Potential sonographic Murphy's sign with diffuse tenderness over the abdomen. Common bile duct: Diameter: Normal at 3 mm. Liver: No focal lesion identified. Within normal limits in parenchymal echogenicity. Portal vein is patent on color Doppler imaging with normal direction of blood flow towards the liver. Other: None.  IMPRESSION: 1. Gallbladder wall thickening and gallstone without gallbladder distension. Equivocal Murphy's sign with patient extremely tender to touch over the entirety of the abdomen. 2. Common bile duct normal caliber. 3. Consider nuclear medicine HIDA scan if continued concern for acute cholecystitis. Electronically Signed   By: Suzy Bouchard M.D.   On: 08/02/2021 13:01  Management plans discussed with the patient, family and they are in agreement.  CODE STATUS:     Code Status Orders  (From admission, onward)           Start     Ordered   07/31/21 1608  Do not attempt resuscitation (DNR)  Continuous       Question Answer Comment  In the event of cardiac or respiratory ARREST Do not call a "code blue"   In the event of cardiac or respiratory ARREST Do not perform Intubation, CPR, defibrillation or ACLS   In the event of cardiac or respiratory ARREST Use medication by any route, position, wound care, and other measures to relive pain and suffering. May use oxygen, suction and manual treatment of airway obstruction as needed for comfort.   Comments MOST form on chart.      07/31/21 1607           Code Status History     Date Active Date Inactive Code Status Order ID Comments User Context   07/31/2021 1606 07/31/2021 1607 DNR [id number]  Asencion Gowda, NP ED   07/31/2021 0307 07/31/2021 1606 Full Code [id number]  Mansy, Arvella Merles, MD ED   07/23/2021 2134 07/29/2021 2219 Full Code [id number]  Athena Masse, MD ED       TOTAL TIME TAKING CARE OF THIS PATIENT: 34 minutes.    Loletha Grayer M.D on 08/03/2021 at 11:16 AM   [facility]  CC: Primary care physician; Townsend Roger, MD

## 2021-08-03 NOTE — Progress Notes (Signed)
Chaplain Maggie made initial visit with family present at bedside. Patient requested Advanced Directive forms which Chaplain delivered and explained. Follow up to complete AD by contacting on call Chaplain M-F between 8:30am-4:30pm at [phone]

## 2021-08-03 NOTE — Progress Notes (Signed)
[facility]  ROUNDING NOTE   Subjective:   Linda Quinn is a 70 y.o. female resident of a SNF with past medical conditions including CHF, Atrial fib, diabetes, sleep apnea and ESRD on dialysis. Patient was sent to ED with complaints of shortness of breath and lethargy. There were also reports of AICD firing before ED arrival. She will be admitted for Ventricular tachycardia ([place]) [I47.2] Acute pulmonary edema ([place]) [J81.0] Respiratory distress [R06.03] ESRD (end stage renal disease) on dialysis ([place]) [N18.6, Z99.2] AICD discharge [Z45.02] Hypotension [I95.9]  Patient is known to this practice from previous admissions. She receives dialysis at the [facility] under the [facility] specialist. She was recently admitted earlier this month for abdominal cellulitis, fluid overload and AICD firing. She was discharged after dialysis on Tuesday. While in ED, she has had 2 runs of [facility] resulting in AICD firing.  Patient was seen today in ICU. Patient had changes her mind about hospice/palliative care Patient main complaint in today's visit was that she was feeling better than yesterday    Objective:  Vital signs in last 24 hours:  Temp:  [97.1 F (36.2 C)-97.4 F (36.3 C)] 97.1 F (36.2 C) (08/14 0400) Pulse Rate:  [62-139] 107 (08/14 0500) Resp:  [12-20] 17 (08/14 0500) BP: (62-124)/(31-87) 91/58 (08/14 0400) SpO2:  [52 %-100 %] 52 % (08/14 0500)  Weight change:  Filed Weights   08/01/21 1756  Weight: (!) 141 kg    Intake/Output: I/O last 3 completed shifts: In: -  Out: 1000 [Other:1000]   Intake/Output this shift:  No intake/output data recorded.  Physical Exam: General: NAD, laying in bed  Head: Normocephalic, atraumatic. Moist oral mucosal membranes  Eyes: Anicteric  Lungs:  Diminished, normal effort  Heart: Regular rate and rhythm  Abdomen:  Soft, tenderness  Extremities:  1+ peripheral edema.  Neurologic: Nonfocal, moving all four extremities   Skin: No lesions  Access: Rt Permcath    Basic Metabolic Panel: Recent Labs  Lab 07/31/21 0130 07/31/21 0723 08/01/21 0651 08/02/21 1026 08/03/21 0454  NA 130* 132* 133* 132* 135  K 4.1 4.1 3.7 3.7 3.6  CL 92* 93* 95* 94* 97*  CO2 '23 25 26 26 25  '$ GLUCOSE 159* 156* 138* 138* 113*  BUN 26* 27* 21 26* 18  CREATININE 4.67* 4.68* 3.96* 4.73* 3.79*  CALCIUM 8.5* 8.6* 8.6* 8.8* 8.7*  MG 2.4  --   --   --   --     Liver Function Tests: Recent Labs  Lab 07/31/21 0130  AST 16  ALT 10  ALKPHOS 127*  BILITOT 1.8*  PROT 7.1  ALBUMIN 2.6*   No results for input(s): LIPASE, AMYLASE in the last 168 hours. No results for input(s): AMMONIA in the last 168 hours.  CBC: Recent Labs  Lab 07/27/21 0904 07/31/21 0025 07/31/21 0723 08/02/21 1026  WBC 9.5 9.0 8.1 8.6  NEUTROABS  --  6.7  --   --   HGB 9.8* 10.4* 10.3* 10.5*  HCT 34.1* 35.7* 36.2 37.9  MCV 91.9 94.4 91.9 95.5  PLT 242 298 254 234    Cardiac Enzymes: No results for input(s): CKTOTAL, CKMB, CKMBINDEX, TROPONINI in the last 168 hours.  BNP: Invalid input(s): POCBNP  CBG: Recent Labs  Lab 08/01/21 2100 08/02/21 0731 08/02/21 1149 08/02/21 1617 08/02/21 2053  GLUCAP 142* 105* 134* 128* 115*    Microbiology: Results for orders placed or performed during the hospital encounter of 07/31/21  Resp Panel by RT-PCR (Flu  A&B, Covid) Nasopharyngeal Swab     Status: None   Collection Time: 07/31/21 12:15 AM   Specimen: Nasopharyngeal Swab; Nasopharyngeal(NP) swabs in vial transport medium  Result Value Ref Range Status   SARS Coronavirus 2 by RT PCR NEGATIVE NEGATIVE Final    Comment: (NOTE) SARS-CoV-2 target nucleic acids are NOT DETECTED.  The SARS-CoV-2 RNA is generally detectable in upper respiratory specimens during the acute phase of infection. The lowest concentration of SARS-CoV-2 viral copies this assay can detect is 138 copies/mL. A negative result does not preclude SARS-Cov-2 infection and  should not be used as the sole basis for treatment or other patient management decisions. A negative result may occur with  improper specimen collection/handling, submission of specimen other than nasopharyngeal swab, presence of viral mutation(s) within the areas targeted by this assay, and inadequate number of viral copies(<138 copies/mL). A negative result must be combined with clinical observations, patient history, and epidemiological information. The expected result is Negative.  Fact Sheet for Patients:  EntrepreneurPulse.com.au  Fact Sheet for Healthcare Providers:  IncredibleEmployment.be  This test is no t yet approved or cleared by the [place] FDA and  has been authorized for detection and/or diagnosis of SARS-CoV-2 by FDA under an Emergency Use Authorization (EUA). This EUA will remain  in effect (meaning this test can be used) for the duration of the COVID-19 declaration under Section 564(b)(1) of the Act, 21 U.S.C.section 360bbb-3(b)(1), unless the authorization is terminated  or revoked sooner.       Influenza A by PCR NEGATIVE NEGATIVE Final   Influenza B by PCR NEGATIVE NEGATIVE Final    Comment: (NOTE) The Xpert Xpress SARS-CoV-2/FLU/RSV plus assay is intended as an aid in the diagnosis of influenza from Nasopharyngeal swab specimens and should not be used as a sole basis for treatment. Nasal washings and aspirates are unacceptable for Xpert Xpress SARS-CoV-2/FLU/RSV testing.  Fact Sheet for Patients: EntrepreneurPulse.com.au  Fact Sheet for Healthcare Providers: IncredibleEmployment.be  This test is not yet approved or cleared by the [place] FDA and has been authorized for detection and/or diagnosis of SARS-CoV-2 by FDA under an Emergency Use Authorization (EUA). This EUA will remain in effect (meaning this test can be used) for the duration of the COVID-19 declaration  under Section 564(b)(1) of the Act, 21 U.S.C. section 360bbb-3(b)(1), unless the authorization is terminated or revoked.  Performed at [facility], [address] [place], [place] [address]   MRSA Next Gen by PCR, Nasal     Status: None   Collection Time: 08/01/21  6:00 PM   Specimen: Nasal Mucosa; Nasal Swab  Result Value Ref Range Status   MRSA by PCR Next Gen NOT DETECTED NOT DETECTED Final    Comment: (NOTE) The GeneXpert MRSA Assay (FDA approved for NASAL specimens only), is one component of a comprehensive MRSA colonization surveillance program. It is not intended to diagnose MRSA infection nor to guide or monitor treatment for MRSA infections. Test performance is not FDA approved in patients less than 101 years old. Performed at [facility], [address] [place], [place] [address]     Coagulation Studies: Recent Labs    08/01/21 0651 08/02/21 1026 08/03/21 0454  LABPROT 30.7* 26.7* 23.6*  INR 3.0* 2.5* 2.1*    Urinalysis: No results for input(s): COLORURINE, LABSPEC, PHURINE, GLUCOSEU, HGBUR, BILIRUBINUR, KETONESUR, PROTEINUR, UROBILINOGEN, NITRITE, LEUKOCYTESUR in the last 72 hours.  Invalid input(s): APPERANCEUR    Imaging: US Abdomen Limited RUQ (LIVER/GB)  Result Date: 08/02/2021  CLINICAL DATA:  Abdominal pain for 24 hours EXAM: ULTRASOUND ABDOMEN LIMITED RIGHT UPPER QUADRANT COMPARISON:  None. FINDINGS: Gallbladder: Difficult exam due to patient discomfort. Patient experienced significant pain with probe touching skin. Gallbladder wall is mildly thickened at 4 mm. 10 mm gallstone within nondistended gallbladder. Potential sonographic Murphy's sign with diffuse tenderness over the abdomen. Common bile duct: Diameter: Normal at 3 mm. Liver: No focal lesion identified. Within normal limits in parenchymal echogenicity. Portal vein is patent on color Doppler imaging with normal direction of blood flow towards the liver. Other: None.  IMPRESSION: 1. Gallbladder wall thickening and gallstone without gallbladder distension. Equivocal Murphy's sign with patient extremely tender to touch over the entirety of the abdomen. 2. Common bile duct normal caliber. 3. Consider nuclear medicine HIDA scan if continued concern for acute cholecystitis. Electronically Signed   By: Suzy Bouchard M.D.   On: 08/02/2021 13:01     Medications:    sodium chloride     sodium chloride     amiodarone 30 mg/hr (08/03/21 0202)   piperacillin-tazobactam (ZOSYN)  IV 3.375 g (08/02/21 2140)    calcium carbonate  2 tablet Oral BID   Chlorhexidine Gluconate Cloth  6 each Topical Q0600   Chlorhexidine Gluconate Cloth  6 each Topical Q0600   gabapentin  100 mg Oral BID   insulin aspart  0-5 Units Subcutaneous QHS   insulin aspart  0-6 Units Subcutaneous TID WC   loratadine  10 mg Oral Daily   melatonin  2.5 mg Oral QHS   midodrine  10 mg Oral QID   senna-docusate  2 tablet Oral QHS   warfarin  2 mg Oral ONCE-1600   Warfarin - Pharmacist Dosing Inpatient   Does not apply q1600   sodium chloride, sodium chloride, acetaminophen **OR** acetaminophen, alteplase, heparin, lidocaine (PF), lidocaine-prilocaine, [DISCONTINUED] ondansetron **OR** ondansetron (ZOFRAN) IV, ondansetron, oxyCODONE-acetaminophen, pentafluoroprop-tetrafluoroeth, traZODone  Assessment/ Plan:  [name] is a 70 y.o.  female  with diabetes, Atrial Fib on Coumadin, anemia, pacemaker/AICD, and ESRD on HD, who was admitted to [facility] on 07/23/2021 for Ventricular tachycardia ([place]) [I47.2] Acute pulmonary edema ([place]) [J81.0] Respiratory distress [R06.03] ESRD (end stage renal disease) on dialysis ([place]) [N18.6, Z99.2] AICD discharge [Z45.02] Hypotension [I95.9]   [facility] [address]/TTS/Rt Permcath  Renal Patient has end-stage renal disease Patient is on hemodialysis Patient is on Tuesday Thursday Saturday schedule Pt was dialyzed yesterday No need for HD today   2.  Anemia of chronic kidney disease Lab Results  Component Value Date   HGB 10.5 (L) 08/02/2021   Hgb at acceptable range Will monitor labs  3. Secondary Hyperparathyroidism: Lab Results  Component Value Date   CALCIUM 8.7 (L) 08/03/2021   Calcium not at target Calcium carbonate BID  4.  Hypotension Patient is on midodrine  5.  Abdominal pain Patient complain abdominal pain yesterday ultrasound shows gallbladder wall thickening Patient is currently on [place]  NO need for HD today . Patient is on Tuesday Thursday Saturday schedule as an outpatient We will follow     LOS: 3 Akela Pocius s [facility] 8/14/20227:15 AM

## 2021-08-03 NOTE — Consult Note (Addendum)
[place] for warfarin Indication: atrial fibrillation  Allergies  Allergen Reactions   Ciprofloxacin Other (See Comments)   Duricef [Cefadroxil] Hives   Sulfamethoxazole-Trimethoprim Other (See Comments)     Vital Signs: Temp: 98 F (36.7 C) (08/14 0810) Temp Source: Oral (08/14 0810) BP: 116/73 (08/14 1000) Pulse Rate: 70 (08/14 1000)  Labs: Recent Labs    08/01/21 0651 08/02/21 1026 08/03/21 0454  HGB  --  10.5*  --   HCT  --  37.9  --   PLT  --  234  --   LABPROT 30.7* 26.7* 23.6*  INR 3.0* 2.5* 2.1*  CREATININE 3.96* 4.73* 3.79*     Estimated Creatinine Clearance: 21.5 mL/min (A) (by C-G formula based on SCr of 3.79 mg/dL (H)).   Medical History: Past Medical History:  Diagnosis Date   CHF (congestive heart failure) ([place])    Diabetes mellitus without complication (HCC)    Paroxysmal atrial fibrillation (HCC)    Renal disorder    Respiratory failure (HCC)    Sleep apnea     Medications:  PTA warfarin regimen per nursing administration guide: 2.5 mg tablet daily (TWD = 17.5 mg) PTA: doxycycline 100 mg BID new outpatient start 07/28/21 - 08/02/21 ; last dose 8/10 @ 2000 07/31/21: New amiodarone IV start   Assessment: 70 y.o. female with a hx of hx of PAF on Coumadin (INR goal 2-3), ESRD on HD TThSat, ICD presented to ED for VT w/ ICD firing. Patient with recent start of outpatient doxycycline for cellulitis.   DD-interactions: amiodarone -- new start  doxycycline -- stopped  Albumin: 2.6  Date INR Warfarin Dose  8/10 -- 2.5 mg (PTA) 8/11 3.0 1.5 mg  8/12 3.0 1.5 mg  8/13 2.5 2 mg - ordered  but not given.  8/14 2.1 3 mg   Goal of Therapy:  INR 2-3 Monitor platelets by anticoagulation protocol: Yes   Plan:  INR is therapeutic, but trending down. Will give warfarin 3 mg x 1 (increase dose due to missed dose on 8/13). Predict INR to trend down tomorrow. Daily INR ordered. CBC at least every 3 days. Pt new  start on amio, dose with caution.    Oswald Hillock, PharmD, BCPS Clinical Pharmacist   08/03/2021,10:46 AM

## 2021-08-03 NOTE — Progress Notes (Signed)
Pt not yet transferred to [facility]. Confirmed DNR status with IM. No additional recommendations.

## 2021-08-03 NOTE — Consult Note (Signed)
PHARMACY NOTE:  ANTIMICROBIAL RENAL DOSAGE ADJUSTMENT  Current antimicrobial regimen includes a mismatch between antimicrobial dosage and estimated renal function.  As per policy approved by the Pharmacy & Therapeutics and Medical Executive Committees, the antimicrobial dosage will be adjusted accordingly.  Current antimicrobial dosage:  Pip/tazo 3.375 g BID  Indication: Intra-abdominal infection  Renal Function:  Estimated Creatinine Clearance: 21.5 mL/min (A) (by C-G formula based on SCr of 3.79 mg/dL (H)). '[]'$      On intermittent HD, scheduled: '[]'$      On CRRT    Antimicrobial dosage has been changed to:  pip/tazo 3.375 g q8H.      Thank you for allowing pharmacy to be a part of this patient's care.  Oswald Hillock, [facility] 08/03/2021 10:53 AM

## 2021-08-04 DIAGNOSIS — I472 Ventricular tachycardia: Secondary | ICD-10-CM | POA: Diagnosis not present

## 2021-08-04 DIAGNOSIS — I5043 Acute on chronic combined systolic (congestive) and diastolic (congestive) heart failure: Secondary | ICD-10-CM

## 2021-08-04 LAB — BASIC METABOLIC PANEL
Anion gap: 10 (ref 5–15)
BUN: 21 mg/dL (ref 8–23)
CO2: 27 mmol/L (ref 22–32)
Calcium: 8.8 mg/dL — ABNORMAL LOW (ref 8.9–10.3)
Chloride: 98 mmol/L (ref 98–111)
Creatinine, Ser: 4.24 mg/dL — ABNORMAL HIGH (ref 0.44–1.00)
GFR, Estimated: 11 mL/min — ABNORMAL LOW (ref >60)
Glucose, Bld: 175 mg/dL — ABNORMAL HIGH (ref 70–99)
Potassium: 3.8 mmol/L (ref 3.5–5.1)
Sodium: 135 mmol/L (ref 135–145)

## 2021-08-04 LAB — PROTIME-INR
INR: 2 — ABNORMAL HIGH (ref 0.8–1.2)
Prothrombin Time: 23 seconds — ABNORMAL HIGH (ref 11.4–15.2)

## 2021-08-04 LAB — GLUCOSE, CAPILLARY: Glucose-Capillary: 125 mg/dL — ABNORMAL HIGH (ref 70–99)

## 2021-08-04 MED ORDER — AMIODARONE HCL 200 MG PO TABS
400.0000 mg | ORAL_TABLET | Freq: Two times a day (BID) | ORAL | Status: DC
  Administered 2021-08-04: 400 mg via ORAL
  Filled 2021-08-04: qty 2

## 2021-08-04 MED ORDER — PIPERACILLIN-TAZOBACTAM IN DEX 2-0.25 GM/50ML IV SOLN: 2.2500 g | Freq: Three times a day (TID) | INTRAVENOUS | 0 refills | Status: AC

## 2021-08-04 MED ORDER — PIPERACILLIN-TAZOBACTAM IN DEX 2-0.25 GM/50ML IV SOLN
2.2500 g | Freq: Three times a day (TID) | INTRAVENOUS | Status: DC
  Filled 2021-08-04 (×2): qty 50

## 2021-08-04 MED ORDER — AMIODARONE HCL 400 MG PO TABS: 400.0000 mg | ORAL_TABLET | Freq: Two times a day (BID) | ORAL | 0 refills | Status: AC

## 2021-08-04 NOTE — TOC Progression Note (Signed)
Transition of Care [facility]) - Progression Note    Patient Details  Name: Linda Quinn MRN: [id number] Date of Birth: [birth year]  Transition of Care [facility]) CM/SW [place], [place] Phone Number: [phone] 08/04/2021, 9:29 AM  Clinical Narrative:     Patient recently discharged from [facility] on 07/29/2021 back to [facility] w/ long term care. Patient was brought to [facility] ED due to [facility] after HD treatment at [facility] on [address].  Patient has spoken to Palliative Care NP ans after much deliberation patient has decided on PARTIAL CODE status, no ventilation/intubation but yes to CPR. Patient also met with Chaplain and requested Advanced Directive paperwork, which the Chaplain provided.   Patient has stated she would like to transfer to [facility] but as of today 08/04/2021 there is no confirmation of a transfer.  Patient is not independent with ADLs and needs assistance with tasks but is alert and oriented X4.  Patient's main contact is Wiggins,Norman "Awanda Mink" (Spouse)  [phone] [facility] Phone), but patient has stated they are legally married but not close. I would recommend speaking with patient first before contact spouse for updates or collateral information.Patient has verbalized her nieces are allowed to receive updates.    Expected Discharge Plan: Long Term Nursing Home Barriers to Discharge: Continued Medical Work up  Expected Discharge Plan and Services Expected Discharge Plan: [place] In-house Referral: Clinical Social Work, Clinical biochemist, Hospice / [place] Acute Care Choice: Nursing Home Living arrangements for the past 2 months: [place] ([place]) Expected Discharge Date: 08/01/21                                     Social Determinants of Health (SDOH) Interventions    Readmission Risk Interventions No flowsheet data found.

## 2021-08-04 NOTE — Progress Notes (Signed)
[facility]  ROUNDING NOTE   Subjective:   Linda Quinn is a 70 y.o. female resident of a SNF with past medical conditions including CHF, Atrial fib, diabetes, sleep apnea and ESRD on dialysis. Patient was sent to ED with complaints of shortness of breath and lethargy. There were also reports of AICD firing before ED arrival. She will be admitted for Ventricular tachycardia ([place]) [I47.2] Acute pulmonary edema ([place]) [J81.0] Respiratory distress [R06.03] ESRD (end stage renal disease) on dialysis ([place]) [N18.6, Z99.2] AICD discharge [Z45.02] Hypotension [I95.9]  Patient is known to this practice from previous admissions. She receives dialysis at the [facility] under the [facility] specialist. She was recently admitted earlier this month for abdominal cellulitis, fluid overload and AICD firing. She was discharged after dialysis on Tuesday. While in ED, she has had 2 runs of [facility] resulting in AICD firing.   Patient seen in ICU Alert States she's feeling well today Agreeable to hospital transfer    Objective:  Vital signs in last 24 hours:  Temp:  [98.2 F (36.8 C)-98.7 F (37.1 C)] 98.4 F (36.9 C) (08/15 1042) Pulse Rate:  [70-137] 75 (08/15 1042) Resp:  [12-16] 16 (08/15 1042) BP: (92-103)/(52-75) 102/75 (08/15 1042) SpO2:  [68 %-97 %] 90 % (08/15 1042)  Weight change:  Filed Weights   08/01/21 1756  Weight: (!) 141 kg    Intake/Output: No intake/output data recorded.   Intake/Output this shift:  Total I/O In: 120 [P.O.:120] Out: -   Physical Exam: General: NAD, laying in bed  Head: Normocephalic, atraumatic. Moist oral mucosal membranes  Eyes: Anicteric  Lungs:  Diminished, normal effort  Heart: Regular rate and rhythm  Abdomen:  Soft, tenderness  Extremities:  2+ peripheral edema.  Neurologic: Nonfocal, moving all four extremities  Skin: No lesions  Access: Rt Permcath    Basic Metabolic Panel: Recent Labs  Lab 07/31/21 0130  07/31/21 0723 08/01/21 0651 08/02/21 1026 08/03/21 0454 08/04/21 0410  NA 130* 132* 133* 132* 135 135  K 4.1 4.1 3.7 3.7 3.6 3.8  CL 92* 93* 95* 94* 97* 98  CO2 '23 25 26 26 25 27  '$ GLUCOSE 159* 156* 138* 138* 113* 175*  BUN 26* 27* 21 26* 18 21  CREATININE 4.67* 4.68* 3.96* 4.73* 3.79* 4.24*  CALCIUM 8.5* 8.6* 8.6* 8.8* 8.7* 8.8*  MG 2.4  --   --   --   --   --      Liver Function Tests: Recent Labs  Lab 07/31/21 0130  AST 16  ALT 10  ALKPHOS 127*  BILITOT 1.8*  PROT 7.1  ALBUMIN 2.6*    No results for input(s): LIPASE, AMYLASE in the last 168 hours. No results for input(s): AMMONIA in the last 168 hours.  CBC: Recent Labs  Lab 07/31/21 0025 07/31/21 0723 08/02/21 1026  WBC 9.0 8.1 8.6  NEUTROABS 6.7  --   --   HGB 10.4* 10.3* 10.5*  HCT 35.7* 36.2 37.9  MCV 94.4 91.9 95.5  PLT 298 254 234     Cardiac Enzymes: No results for input(s): CKTOTAL, CKMB, CKMBINDEX, TROPONINI in the last 168 hours.  BNP: Invalid input(s): POCBNP  CBG: Recent Labs  Lab 08/03/21 0739 08/03/21 1127 08/03/21 1606 08/03/21 2114 08/04/21 0717  GLUCAP 119* 165* 129* 176* 125*     Microbiology: Results for orders placed or performed during the hospital encounter of 07/31/21  Resp Panel by RT-PCR (Flu A&B, Covid) Nasopharyngeal Swab     Status:  None   Collection Time: 07/31/21 12:15 AM   Specimen: Nasopharyngeal Swab; Nasopharyngeal(NP) swabs in vial transport medium  Result Value Ref Range Status   SARS Coronavirus 2 by RT PCR NEGATIVE NEGATIVE Final    Comment: (NOTE) SARS-CoV-2 target nucleic acids are NOT DETECTED.  The SARS-CoV-2 RNA is generally detectable in upper respiratory specimens during the acute phase of infection. The lowest concentration of SARS-CoV-2 viral copies this assay can detect is 138 copies/mL. A negative result does not preclude SARS-Cov-2 infection and should not be used as the sole basis for treatment or other patient management decisions. A  negative result may occur with  improper specimen collection/handling, submission of specimen other than nasopharyngeal swab, presence of viral mutation(s) within the areas targeted by this assay, and inadequate number of viral copies(<138 copies/mL). A negative result must be combined with clinical observations, patient history, and epidemiological information. The expected result is Negative.  Fact Sheet for Patients:  EntrepreneurPulse.com.au  Fact Sheet for Healthcare Providers:  IncredibleEmployment.be  This test is no t yet approved or cleared by the [place] FDA and  has been authorized for detection and/or diagnosis of SARS-CoV-2 by FDA under an Emergency Use Authorization (EUA). This EUA will remain  in effect (meaning this test can be used) for the duration of the COVID-19 declaration under Section 564(b)(1) of the Act, 21 U.S.C.section 360bbb-3(b)(1), unless the authorization is terminated  or revoked sooner.       Influenza A by PCR NEGATIVE NEGATIVE Final   Influenza B by PCR NEGATIVE NEGATIVE Final    Comment: (NOTE) The Xpert Xpress SARS-CoV-2/FLU/RSV plus assay is intended as an aid in the diagnosis of influenza from Nasopharyngeal swab specimens and should not be used as a sole basis for treatment. Nasal washings and aspirates are unacceptable for Xpert Xpress SARS-CoV-2/FLU/RSV testing.  Fact Sheet for Patients: EntrepreneurPulse.com.au  Fact Sheet for Healthcare Providers: IncredibleEmployment.be  This test is not yet approved or cleared by the [place] FDA and has been authorized for detection and/or diagnosis of SARS-CoV-2 by FDA under an Emergency Use Authorization (EUA). This EUA will remain in effect (meaning this test can be used) for the duration of the COVID-19 declaration under Section 564(b)(1) of the Act, 21 U.S.C. section 360bbb-3(b)(1), unless the authorization  is terminated or revoked.  Performed at [facility], [address] [place], [place] [address]   MRSA Next Gen by PCR, Nasal     Status: None   Collection Time: 08/01/21  6:00 PM   Specimen: Nasal Mucosa; Nasal Swab  Result Value Ref Range Status   MRSA by PCR Next Gen NOT DETECTED NOT DETECTED Final    Comment: (NOTE) The GeneXpert MRSA Assay (FDA approved for NASAL specimens only), is one component of a comprehensive MRSA colonization surveillance program. It is not intended to diagnose MRSA infection nor to guide or monitor treatment for MRSA infections. Test performance is not FDA approved in patients less than 20 years old. Performed at [facility], [address] [place], [place] [address]     Coagulation Studies: Recent Labs    08/02/21 1026 08/03/21 0454 08/04/21 0410  LABPROT 26.7* 23.6* 23.0*  INR 2.5* 2.1* 2.0*     Urinalysis: No results for input(s): COLORURINE, LABSPEC, PHURINE, GLUCOSEU, HGBUR, BILIRUBINUR, KETONESUR, PROTEINUR, UROBILINOGEN, NITRITE, LEUKOCYTESUR in the last 72 hours.  Invalid input(s): APPERANCEUR    Imaging: No results found.   Medications:    sodium chloride     sodium chloride  piperacillin-tazobactam (ZOSYN)  IV      amiodarone  400 mg Oral BID   calcium carbonate  2 tablet Oral BID   Chlorhexidine Gluconate Cloth  6 each Topical Q0600   Chlorhexidine Gluconate Cloth  6 each Topical Q0600   gabapentin  100 mg Oral TID   insulin aspart  0-5 Units Subcutaneous QHS   insulin aspart  0-6 Units Subcutaneous TID WC   loratadine  10 mg Oral Daily   melatonin  2.5 mg Oral QHS   midodrine  10 mg Oral QID   senna-docusate  2 tablet Oral QHS   Warfarin - Pharmacist Dosing Inpatient   Does not apply q1600   sodium chloride, sodium chloride, acetaminophen **OR** acetaminophen, alteplase, heparin, lidocaine (PF), lidocaine-prilocaine, [DISCONTINUED] ondansetron **OR** ondansetron (ZOFRAN) IV, ondansetron,  oxyCODONE-acetaminophen, pentafluoroprop-tetrafluoroeth, traZODone  Assessment/ Plan:  Linda Quinn is a 70 y.o.  female  with diabetes, Atrial Fib on Coumadin, anemia, pacemaker/AICD, and ESRD on HD, who was admitted to [facility] on 07/23/2021 for Ventricular tachycardia ([place]) [I47.2] Acute pulmonary edema ([place]) [J81.0] Respiratory distress [R06.03] ESRD (end stage renal disease) on dialysis ([place]) [N18.6, Z99.2] AICD discharge [Z45.02] Hypotension [I95.9]   [facility] [address]/TTS/Rt Permcath  Renal Patient has end-stage renal disease on hemodialysis Dialysis treatment Thursday resulted in extensive [facility] with AICD firing during two separate incidents. Palliative discussed this event with patient and she decided to become a DNR, stop dialysis and have AICD turned off. This changed over the weekend and patient was dialyzed on Saturday. Request made to be transferred to [facility].    2. Anemia of chronic kidney disease Lab Results  Component Value Date   HGB 10.5 (L) 08/02/2021   Hgb at acceptable range Will monitor labs  3. Secondary Hyperparathyroidism: Lab Results  Component Value Date   CALCIUM 8.8 (L) 08/04/2021   Calcium not at target Calcium carbonate BID  4.  Hypotension Patient is on midodrine  5.  Abdominal pain Patient complain abdominal pain yesterday ultrasound shows gallbladder wall thickening Patient is currently on Zosyn      LOS: 4 [place] 8/15/20223:50 PM

## 2021-08-04 NOTE — Progress Notes (Signed)
   08/04/21 0900  Clinical Encounter Type  Visited With Patient  Visit Type Follow-up  Referral From Nurse  Consult/Referral To Chaplain  Spiritual Encounters  Spiritual Needs Brochure;Prayer;Emotional  Chaplain Ayodele Sangalang did a FU visit for an AD in ICU-11 Pt, Mrs. Linda Quinn. While waiting for notary to come I provide reflective listening, assisted pt with some other personal things and prayer. Pt thanked me for the prayer and for helping her. Pt was in good spirits and very pleasant.

## 2021-08-04 NOTE — Discharge Summary (Signed)
[facility] [place] NAME: Linda Quinn    MR#:  [id number]  DATE OF BIRTH:  [birth year]  DATE OF ADMISSION:  07/31/2021 ADMITTING PHYSICIAN: Loletha Grayer, MD  DATE OF DISCHARGE: 08/04/2021  PRIMARY CARE PHYSICIAN: Townsend Roger, MD    ADMISSION DIAGNOSIS:  Ventricular tachycardia ([place]) [I47.2] Acute pulmonary edema (HCC) [J81.0] Respiratory distress [R06.03] ESRD (end stage renal disease) on dialysis ([place]) [N18.6, Z99.2] AICD discharge [Z45.02] Hypotension [I95.9]  DISCHARGE DIAGNOSIS:  Active Problems:   Type 2 diabetes mellitus with diabetic neuropathy, with long-term current use of insulin (HCC)   ESRD (end stage renal disease) on dialysis (HCC)   Hypotension   AICD discharge   Right upper quadrant abdominal pain   Acute respiratory failure with hypoxia ([place])   SECONDARY DIAGNOSIS:   Past Medical History:  Diagnosis Date  . CHF (congestive heart failure) ([place])   . Diabetes mellitus without complication ([place])   . Paroxysmal atrial fibrillation (HCC)   . Renal disorder   . Respiratory failure ([place])   . Sleep apnea     HOSPITAL COURSE:  Linda Quinn is a 70 y.o. female with a hx of hx of PAF on Coumadin, history of [employer] ICD in 12/2017 with further details unclear followed by [facility], ESRD on HD TTS, bedbound status, anemia of chronic disease, history of PE, and obesity who came to the ER for the evaluation of VT with ICD firing at the facility.   Persistent hypotension.  Continue midodrine 10 mg 4 times a day.  Can use pressors if needed with dialysis. AICD firing and ventricular tachycardia.  Limited with medication secondary to hypotension.pt was  on amiodarone drip.--now changed to po Amiodarone per [facility] cards rec.  Patient accepted to [facility] but currently does  have a bed.  The AICD was not turned off since she declined comfort care. Right upper quadrant pain to light touch.  Previous CT scan of the abdomen did  show subcutaneous edema consistent with anasarca.  Right upper quadrant ultrasound showed gallbladder wall thickening and gallstones without gallbladder distention.  I did start on empiric Zosyn just in case this is gallbladder cause.  Since her pain is to the light touch I believe it is likely nerve related pain.  I do not see a rash so this is less likely shingles.  Increase gabapentin to 3 times daily dosing.  Can consider a HIDA scan--will defer further w/u at [facility] Acute on chronic systolic congestive heart failure with anasarca.  EF less than 20% on recent echocardiogram.  Limited with medication secondary to hypotension.  Dialysis is the only way to remove fluid. End-stage renal disease had dialysis on Thursday evening and Saturday--Nephrology to follow up for HD Type 2 diabetes mellitus with peripheral neuropathy on gabapentin.  Increasing the dose to 3 times daily dosing.  Sliding scale insulin for now.  Can go back on Lantus insulin.  stage II decubiti, present on admission on buttock, left ankle and right posterior leg--cont dressing changes per [facility] recs Acute hypoxic respiratory failure.  This morning again on 3 L bubble high flow nasal cannula.  Continue oxygen supplementation. Appreciate palliative care consultation Patient is a Partial code--she wants everything except Intubation Paroxysmal atrial fibrillation.  Pharmacy dosing Coumadin. D/w pt and she is in agreement to [facility] transfer Pt 's family is aware  DISCHARGE CONDITIONS:   Fair  CONSULTS OBTAINED:  Treatment Team:  Murlean Iba, MD Vickie Epley, MD  DRUG ALLERGIES:  Allergies  Allergen Reactions  . Ciprofloxacin Other (See Comments)  . Duricef [Cefadroxil] Hives  . Sulfamethoxazole-Trimethoprim Other (See Comments)    DISCHARGE MEDICATIONS:   Allergies as of 08/04/2021       Reactions   Ciprofloxacin Other (See Comments)   Duricef [cefadroxil] Hives   Sulfamethoxazole-trimethoprim Other (See Comments)         Medication List     STOP taking these medications    doxycycline 100 MG tablet Commonly known as: VIBRA-TABS       TAKE these medications    acetaminophen 325 MG tablet Commonly known as: TYLENOL Take 2 tablets (650 mg total) by mouth every 6 (six) hours as needed for mild pain (or Fever >/= 101).   amiodarone 360-4.14 MG/200ML-% Soln Commonly known as: NEXTERONE PREMIX Inject 30 mg/hr into the vein continuous.   amiodarone 400 MG tablet Commonly known as: PACERONE Take 1 tablet (400 mg total) by mouth 2 (two) times daily.   aspirin EC 81 MG tablet Take 81 mg by mouth daily. Swallow whole.   calcitRIOL 0.5 MCG capsule Commonly known as: ROCALTROL Take 0.5 mcg by mouth daily.   calcium carbonate 500 MG chewable tablet Commonly known as: TUMS - dosed in mg elemental calcium Chew 2 tablets (400 mg of elemental calcium total) by mouth 2 (two) times daily.   Chlorhexidine Gluconate Cloth 2 % Pads Apply 6 each topically daily at 6 (six) AM.   gabapentin 100 MG capsule Commonly known as: NEURONTIN Take 1 capsule (100 mg total) by mouth 3 (three) times daily. What changed: when to take this   insulin glargine 100 UNIT/ML injection Commonly known as: LANTUS Inject 10 Units into the skin daily.   insulin lispro 100 UNIT/ML KwikPen Commonly known as: HUMALOG Inject 0-10 Units into the skin 3 (three) times daily.   lidocaine 5 % Commonly known as: LIDODERM Place 1 patch onto the skin daily. Remove & Discard patch within 12 hours or as directed by MD   lidocaine-prilocaine cream Commonly known as: EMLA Apply 1 application topically as needed (topical anesthesia for hemodialysis if Gebauers and Lidocaine injection are ineffective.).   loratadine 10 MG tablet Commonly known as: CLARITIN Take 10 mg by mouth daily.   melatonin 3 MG Tabs tablet Take 3 mg by mouth at bedtime.   midodrine 10 MG tablet Commonly known as: PROAMATINE Take 1 tablet (10 mg  total) by mouth 4 (four) times daily. What changed: when to take this   pentafluoroprop-tetrafluoroeth Aero Commonly known as: GEBAUERS Apply 1 application topically as needed (topical anesthesia for hemodialysis).   piperacillin-tazobactam 2-0.25 GM/50ML IVPB Commonly known as: ZOSYN Inject 50 mLs (2.25 g total) into the vein every 8 (eight) hours.   polyethylene glycol 17 g packet Commonly known as: MIRALAX / GLYCOLAX Take 17 g by mouth daily.   senna-docusate 8.6-50 MG tablet Commonly known as: Senokot-S Take 2 tablets by mouth at bedtime.   vitamin C 500 MG tablet Commonly known as: ASCORBIC ACID Take 500 mg by mouth daily.   warfarin 3 MG tablet Commonly known as: COUMADIN Take 1 tablet (3 mg total) by mouth one time only at 4 PM. What changed:  medication strength how much to take when to take this        DISCHARGE INSTRUCTIONS:   Follow-up with team at [facility]  If you experience worsening of your admission symptoms, develop shortness of breath, life threatening emergency, suicidal or homicidal thoughts you must seek medical attention immediately  by calling 911 or calling your MD immediately  if symptoms less severe.  You Must read complete instructions/literature along with all the possible adverse reactions/side effects for all the Medicines you take and that have been prescribed to you. Take any new Medicines after you have completely understood and accept all the possible adverse reactions/side effects.   Please note  You were cared for by a hospitalist during your hospital stay. If you have any questions about your discharge medications or the care you received while you were in the hospital after you are discharged, you can call the unit and asked to speak with the hospitalist on call if the hospitalist that took care of you is not available. Once you are discharged, your primary care physician will handle any further medical issues. Please note that NO REFILLS  for any discharge medications will be authorized once you are discharged, as it is imperative that you return to your primary care physician (or establish a relationship with a primary care physician if you do not have one) for your aftercare needs so that they can reassess your need for medications and monitor your lab values.    Today   CHIEF COMPLAINT:   Chief Complaint  Patient presents with  . Shortness of Breath  Doing ok. Pt was doing discussing her code status  with chaplain    VITAL SIGNS:  Blood pressure (!) 92/53, pulse (!) 137, temperature 98.2 F (36.8 C), temperature source Oral, resp. rate 14, height '5\' 8"'$  (1.727 m), weight (!) 141 kg, SpO2 (!) 68 %.  I/O:   Intake/Output Summary (Last 24 hours) at 08/04/2021 1023 Last data filed at 08/04/2021 0900 Gross per 24 hour  Intake 120 ml  Output --  Net 120 ml    PHYSICAL EXAMINATION:  GENERAL:  70 y.o.-year-old patient lying in the bed with no acute distress. obesity HEENT: Head atraumatic, normocephalic. Oropharynx and nasopharynx clear.  LUNGS: decreased breath sounds bilaterally, no wheezing, rales,rhonchi or crepitation. No use of accessory muscles of respiration.  CARDIOVASCULAR: S1, S2 normal. No murmurs, rubs, or gallops.  ABDOMEN: Soft, distended. Bowel sounds present.  Pain to light touch over the right upper quadrant. No rash EXTREMITIES: 3+ pedal edema  NEUROLOGIC: Cranial nerves II through XII are intact.  PSYCHIATRIC: The patient is alert and answers questions appropriately.  SKIN: No rash seen in right upper quadrant.  Lower abdominal open ulcer. DATA REVIEW:   CBC Recent Labs  Lab 08/02/21 1026  WBC 8.6  HGB 10.5*  HCT 37.9  PLT 234    Chemistries  Recent Labs  Lab 07/31/21 0130 07/31/21 0723 08/04/21 0410  NA 130*   < > 135  K 4.1   < > 3.8  CL 92*   < > 98  CO2 23   < > 27  GLUCOSE 159*   < > 175*  BUN 26*   < > 21  CREATININE 4.67*   < > 4.24*  CALCIUM 8.5*   < > 8.8*  MG 2.4   --   --   AST 16  --   --   ALT 10  --   --   ALKPHOS 127*  --   --   BILITOT 1.8*  --   --    < > = values in this interval not displayed.     Microbiology Results  Results for orders placed or performed during the hospital encounter of 07/31/21  Resp Panel by RT-PCR (Flu A&B, Covid)  Nasopharyngeal Swab     Status: None   Collection Time: 07/31/21 12:15 AM   Specimen: Nasopharyngeal Swab; Nasopharyngeal(NP) swabs in vial transport medium  Result Value Ref Range Status   SARS Coronavirus 2 by RT PCR NEGATIVE NEGATIVE Final    Comment: (NOTE) SARS-CoV-2 target nucleic acids are NOT DETECTED.  The SARS-CoV-2 RNA is generally detectable in upper respiratory specimens during the acute phase of infection. The lowest concentration of SARS-CoV-2 viral copies this assay can detect is 138 copies/mL. A negative result does not preclude SARS-Cov-2 infection and should not be used as the sole basis for treatment or other patient management decisions. A negative result may occur with  improper specimen collection/handling, submission of specimen other than nasopharyngeal swab, presence of viral mutation(s) within the areas targeted by this assay, and inadequate number of viral copies(<138 copies/mL). A negative result must be combined with clinical observations, patient history, and epidemiological information. The expected result is Negative.  Fact Sheet for Patients:  EntrepreneurPulse.com.au  Fact Sheet for Healthcare Providers:  IncredibleEmployment.be  This test is no t yet approved or cleared by the [place] FDA and  has been authorized for detection and/or diagnosis of SARS-CoV-2 by FDA under an Emergency Use Authorization (EUA). This EUA will remain  in effect (meaning this test can be used) for the duration of the COVID-19 declaration under Section 564(b)(1) of the Act, 21 U.S.C.section 360bbb-3(b)(1), unless the authorization is  terminated  or revoked sooner.       Influenza A by PCR NEGATIVE NEGATIVE Final   Influenza B by PCR NEGATIVE NEGATIVE Final    Comment: (NOTE) The Xpert Xpress SARS-CoV-2/FLU/RSV plus assay is intended as an aid in the diagnosis of influenza from Nasopharyngeal swab specimens and should not be used as a sole basis for treatment. Nasal washings and aspirates are unacceptable for Xpert Xpress SARS-CoV-2/FLU/RSV testing.  Fact Sheet for Patients: EntrepreneurPulse.com.au  Fact Sheet for Healthcare Providers: IncredibleEmployment.be  This test is not yet approved or cleared by the [place] FDA and has been authorized for detection and/or diagnosis of SARS-CoV-2 by FDA under an Emergency Use Authorization (EUA). This EUA will remain in effect (meaning this test can be used) for the duration of the COVID-19 declaration under Section 564(b)(1) of the Act, 21 U.S.C. section 360bbb-3(b)(1), unless the authorization is terminated or revoked.  Performed at [facility], [address] [place], [place] [address]   MRSA Next Gen by PCR, Nasal     Status: None   Collection Time: 08/01/21  6:00 PM   Specimen: Nasal Mucosa; Nasal Swab  Result Value Ref Range Status   MRSA by PCR Next Gen NOT DETECTED NOT DETECTED Final    Comment: (NOTE) The GeneXpert MRSA Assay (FDA approved for NASAL specimens only), is one component of a comprehensive MRSA colonization surveillance program. It is not intended to diagnose MRSA infection nor to guide or monitor treatment for MRSA infections. Test performance is not FDA approved in patients less than 93 years old. Performed at [facility], [address] [place], [place] [address]     RADIOLOGY:  No results found.     CODE STATUS:     Partial/Limited.--pt wants everything except Do not intubate   Code Status History     Date Active Date Inactive Code Status Order ID Comments  User Context   07/31/2021 1606 07/31/2021 1607 DNR [id number]  Asencion Gowda, NP ED   07/31/2021 0307 07/31/2021 1606 Full Code [id number]  Mansy,  Arvella Merles, MD ED   07/23/2021 2134 07/29/2021 2219 Full Code [id number]  Athena Masse, MD ED       TOTAL TIME TAKING CARE OF THIS PATIENT:40 minutes.    Fritzi Mandes M.D on 08/04/2021 at 10:23 AM   [facility]  CC: Primary care physician; Townsend Roger, MD

## 2021-08-04 NOTE — Progress Notes (Signed)
Progress Note  Patient Name: Linda Quinn Date of Encounter: 08/04/2021  [facility] Cardiologist: None   Subjective   The patient is no longer comfort care and she is now partial code.  She denies chest pain or worsening dyspnea.  She does complain of right upper quadrant abdominal pain.  She continues to be on amiodarone drip with no sustained ventricular tachycardia.  Inpatient Medications    Scheduled Meds:  amiodarone  400 mg Oral BID   calcium carbonate  2 tablet Oral BID   Chlorhexidine Gluconate Cloth  6 each Topical Q0600   Chlorhexidine Gluconate Cloth  6 each Topical Q0600   gabapentin  100 mg Oral TID   insulin aspart  0-5 Units Subcutaneous QHS   insulin aspart  0-6 Units Subcutaneous TID WC   loratadine  10 mg Oral Daily   melatonin  2.5 mg Oral QHS   midodrine  10 mg Oral QID   senna-docusate  2 tablet Oral QHS   Warfarin - Pharmacist Dosing Inpatient   Does not apply q1600   Continuous Infusions:  sodium chloride     sodium chloride     piperacillin-tazobactam (ZOSYN)  IV 3.375 g (08/04/21 0549)   PRN Meds: sodium chloride, sodium chloride, acetaminophen **OR** acetaminophen, alteplase, heparin, lidocaine (PF), lidocaine-prilocaine, [DISCONTINUED] ondansetron **OR** ondansetron (ZOFRAN) IV, ondansetron, oxyCODONE-acetaminophen, pentafluoroprop-tetrafluoroeth, traZODone   Vital Signs    Vitals:   08/04/21 0000 08/04/21 0100 08/04/21 0400 08/04/21 0500  BP: 103/63 99/67 (!) 102/52 (!) 92/53  Pulse:  70 (!) 137   Resp: '15 15 13 14  '$ Temp: 98.2 F (36.8 C)     TempSrc: Oral     SpO2:  95% (!) 68%   Weight:      Height:       No intake or output data in the 24 hours ending 08/04/21 0822 Last 3 Weights 08/01/2021 07/31/2021 07/23/2021  Weight (lbs) 310 lb 13.6 oz (No Data) 313 lb 4.4 oz  Weight (kg) 141 kg (No Data) 142.1 kg      Telemetry    Sinus rhythm with no sustained ventricular tachycardia.  5 beat run of ventricular tachycardia.-  Personally Reviewed  ECG     - Personally Reviewed  Physical Exam   GEN: No acute distress.   Neck: No JVD Cardiac: RRR, no  rubs, or gallops.  1 out of 6 systolic murmur in the aortic area Respiratory: Clear to auscultation bilaterally. GI: Soft, nontender, non-distended  MS: No edema; No deformity. Neuro:  Nonfocal  Psych: Normal affect   Labs    High Sensitivity Troponin:   Recent Labs  Lab 07/10/21 0935 07/10/21 1144 07/23/21 1313 07/31/21 0130 07/31/21 0330  TROPONINIHS 27* 25* 60* 36* 41*      Chemistry Recent Labs  Lab 07/31/21 0130 07/31/21 0723 08/02/21 1026 08/03/21 0454 08/04/21 0410  NA 130*   < > 132* 135 135  K 4.1   < > 3.7 3.6 3.8  CL 92*   < > 94* 97* 98  CO2 23   < > '26 25 27  '$ GLUCOSE 159*   < > 138* 113* 175*  BUN 26*   < > 26* 18 21  CREATININE 4.67*   < > 4.73* 3.79* 4.24*  CALCIUM 8.5*   < > 8.8* 8.7* 8.8*  PROT 7.1  --   --   --   --   ALBUMIN 2.6*  --   --   --   --  AST 16  --   --   --   --   ALT 10  --   --   --   --   ALKPHOS 127*  --   --   --   --   BILITOT 1.8*  --   --   --   --   GFRNONAA 10*   < > 10* 12* 11*  ANIONGAP 15   < > '12 13 10   '$ < > = values in this interval not displayed.     Hematology Recent Labs  Lab 07/31/21 0025 07/31/21 0723 08/02/21 1026  WBC 9.0 8.1 8.6  RBC 3.78* 3.94 3.97  HGB 10.4* 10.3* 10.5*  HCT 35.7* 36.2 37.9  MCV 94.4 91.9 95.5  MCH 27.5 26.1 26.4  MCHC 29.1* 28.5* 27.7*  RDW 23.9* 23.7* 24.2*  PLT 298 254 234    BNP Recent Labs  Lab 07/31/21 0025  BNP >4,500.0*     DDimer No results for input(s): DDIMER in the last 168 hours.   Radiology    US Abdomen Limited RUQ (LIVER/GB)  Result Date: 08/02/2021 CLINICAL DATA:  Abdominal pain for 24 hours EXAM: ULTRASOUND ABDOMEN LIMITED RIGHT UPPER QUADRANT COMPARISON:  None. FINDINGS: Gallbladder: Difficult exam due to patient discomfort. Patient experienced significant pain with probe touching skin. Gallbladder wall is mildly  thickened at 4 mm. 10 mm gallstone within nondistended gallbladder. Potential sonographic Murphy's sign with diffuse tenderness over the abdomen. Common bile duct: Diameter: Normal at 3 mm. Liver: No focal lesion identified. Within normal limits in parenchymal echogenicity. Portal vein is patent on color Doppler imaging with normal direction of blood flow towards the liver. Other: None. IMPRESSION: 1. Gallbladder wall thickening and gallstone without gallbladder distension. Equivocal Murphy's sign with patient extremely tender to touch over the entirety of the abdomen. 2. Common bile duct normal caliber. 3. Consider nuclear medicine HIDA scan if continued concern for acute cholecystitis. Electronically Signed   By: Suzy Bouchard M.D.   On: 08/02/2021 13:01    Cardiac Studies   Echocardiogram done on 2023/08/09 showed an EF of less than 20%, severely dilated left atrium, moderately reduced RV function and moderate tricuspid regurgitation  Patient Profile     70 y.o. female with a hx of hx of PAF on Coumadin, history of [employer] ICD in 12/2017 with further details unclear followed by [facility], ESRD on HD TTS, bedbound status, anemia of chronic disease, history of PE, and obesity who is being seen for sustained ventricular tachycardia and ICD shocks.  Assessment & Plan    1.  Ventricular tachycardia status post ICD shock: Improved with IV amiodarone.  Recommend switching to oral amiodarone 400 mg twice daily for 1 week followed by 400 mg once daily after. Limited options overall.  I do not think she is a good candidate for ventricular tachycardia ablation.  2.  Acute on chronic systolic heart failure: Worsened by ventricular tachycardia.  Fluid management as per dialysis but has been limited by arrhythmia and low blood pressure. Not able to start heart failure medications due to hypotension. Overall prognosis is poor.  Given the patient's comorbidities, she is not a candidate for advanced heart failure  therapy such as left ventricular assist device. In addition, inotropes are not indicated at the present time and there is no clear endpoint.  3.  Paroxysmal atrial fibrillation: The patient is on anticoagulation with warfarin with a target INR between 2 and 3.  4.  Abdominal pain: Imaging  showed gallbladder wall thickening.  Not a good surgical candidate and currently on antibiotics.      For questions or updates, please contact [place] Please consult [email or web address] for contact info under        Signed, Kathlyn Sacramento, MD  08/04/2021, 8:22 AM

## 2021-08-21 DEATH — deceased
